# Patient Record
Sex: Female | Born: 1975 | Race: White | Hispanic: No | Marital: Married | Smoking: Never smoker
Health system: Southern US, Community
[De-identification: ages and names within clinical notes are randomized; demographics above are authoritative.]

## PROBLEM LIST (undated history)

## (undated) DIAGNOSIS — R7989 Other specified abnormal findings of blood chemistry: Secondary | ICD-10-CM

## (undated) DIAGNOSIS — L719 Rosacea, unspecified: Secondary | ICD-10-CM

## (undated) DIAGNOSIS — J45909 Unspecified asthma, uncomplicated: Secondary | ICD-10-CM

## (undated) DIAGNOSIS — T7840XA Allergy, unspecified, initial encounter: Secondary | ICD-10-CM

## (undated) DIAGNOSIS — Z8781 Personal history of (healed) traumatic fracture: Secondary | ICD-10-CM

## (undated) HISTORY — DX: Allergy, unspecified, initial encounter: T78.40XA

## (undated) HISTORY — DX: Other specified abnormal findings of blood chemistry: R79.89

## (undated) HISTORY — DX: Rosacea, unspecified: L71.9

## (undated) HISTORY — DX: Personal history of (healed) traumatic fracture: Z87.81

## (undated) HISTORY — PX: APPENDECTOMY: SHX54

## (undated) HISTORY — DX: Unspecified asthma, uncomplicated: J45.909

---

## 1999-08-29 ENCOUNTER — Other Ambulatory Visit: Admission: RE | Admit: 1999-08-29 | Discharge: 1999-08-29 | Payer: Self-pay | Admitting: *Deleted

## 2000-09-09 ENCOUNTER — Other Ambulatory Visit: Admission: RE | Admit: 2000-09-09 | Discharge: 2000-09-09 | Payer: Self-pay | Admitting: Obstetrics and Gynecology

## 2001-09-01 ENCOUNTER — Other Ambulatory Visit: Admission: RE | Admit: 2001-09-01 | Discharge: 2001-09-01 | Payer: Self-pay | Admitting: Obstetrics & Gynecology

## 2002-09-22 ENCOUNTER — Other Ambulatory Visit: Admission: RE | Admit: 2002-09-22 | Discharge: 2002-09-22 | Payer: Self-pay | Admitting: Obstetrics & Gynecology

## 2011-02-13 ENCOUNTER — Ambulatory Visit: Payer: Self-pay

## 2011-02-13 ENCOUNTER — Other Ambulatory Visit: Payer: Self-pay | Admitting: Occupational Medicine

## 2011-02-13 DIAGNOSIS — M79672 Pain in left foot: Secondary | ICD-10-CM

## 2013-12-29 ENCOUNTER — Ambulatory Visit (INDEPENDENT_AMBULATORY_CARE_PROVIDER_SITE_OTHER): Payer: BC Managed Care – PPO | Admitting: Internal Medicine

## 2013-12-29 ENCOUNTER — Encounter: Payer: Self-pay | Admitting: Internal Medicine

## 2013-12-29 VITALS — BP 132/90 | HR 76 | Temp 98.3°F | Wt 216.0 lb

## 2013-12-29 DIAGNOSIS — J0100 Acute maxillary sinusitis, unspecified: Secondary | ICD-10-CM

## 2013-12-29 MED ORDER — CEFUROXIME AXETIL 500 MG PO TABS: 500.0000 mg | ORAL_TABLET | Freq: Two times a day (BID) | ORAL | Status: DC

## 2013-12-29 NOTE — Patient Instructions (Signed)

## 2013-12-29 NOTE — Progress Notes (Signed)
Pre visit review using our clinic review tool, if applicable. No additional management support is needed unless otherwise documented below in the visit note. 

## 2013-12-29 NOTE — Progress Notes (Signed)
HPI  Pt presents to the clinic today with c/o cough and chest congestion. This started about 2 weeks ago. She has had some associated nasal congestion, facial pressure. She is blowing green mucous out of her nose. She denies fever, chills or body aches. She has tried Tylenol cold and flu and Tussin. She does have a history of seasonal allergies and asthma. She has had sick contacts. She does not smoke.  Review of Systems     History reviewed. No pertinent past medical history.  History reviewed. No pertinent family history.  History   Social History  . Marital Status: Married    Spouse Name: N/A    Number of Children: N/A  . Years of Education: N/A   Occupational History  . Not on file.   Social History Main Topics  . Smoking status: Never Smoker   . Smokeless tobacco: Not on file  . Alcohol Use: No  . Drug Use: Not on file  . Sexual Activity: Not on file   Other Topics Concern  . Not on file   Social History Narrative  . No narrative on file    No Known Allergies   Constitutional: Positive headache, fatigue. Denies fever or abrupt weight changes.  HEENT:  Positive facial pressure, nasal congestion and sore throat. Denies eye redness, eye pain, pressure behind the eyes, ear pain, ringing in the ears, wax buildup, runny nose or bloody nose. Respiratory: Positive cough. Denies difficulty breathing or shortness of breath.  Cardiovascular: Denies chest pain, chest tightness, palpitations or swelling in the hands or feet.   No other specific complaints in a complete review of systems (except as listed in HPI above).  Objective:   BP 132/90 mmHg  Pulse 76  Temp(Src) 98.3 F (36.8 C) (Oral)  Wt 216 lb (97.977 kg)  SpO2 98%  LMP 11/30/2013 Wt Readings from Last 3 Encounters:  12/29/13 216 lb (97.977 kg)     General: Appears her stated age, well developed, well nourished in NAD. HEENT: Head: normal shape and size, maxillary sinus tenderness noted; Ears: Tm's gray  and intact, normal light reflex; Nose: mucosa pink and moist, septum midline; Throat/Mouth: + PND. Teeth present, mucosa erythematous and moist, no exudate noted, no lesions or ulcerations noted.  Cardiovascular: Normal rate and rhythm. S1,S2 noted.  No murmur, rubs or gallops noted. Pulmonary/Chest: Normal effort and scattered rhonchi throughout. No respiratory distress. No wheezes, rales or noted.      Assessment & Plan:   Acute Maxillary Sinusitis/Bronchitis:  No wheezing-use albuterol prn, no need for prednisone Will give RX for ceftin BID x 10 days (she reports augmentin does not work) Try flonase OTC Delsym OTC for cough  RTC as needed or if symptoms persist.

## 2014-03-21 ENCOUNTER — Encounter: Payer: Self-pay | Admitting: Family Medicine

## 2014-03-21 ENCOUNTER — Ambulatory Visit (INDEPENDENT_AMBULATORY_CARE_PROVIDER_SITE_OTHER): Payer: BC Managed Care – PPO | Admitting: Family Medicine

## 2014-03-21 VITALS — BP 132/86 | HR 74 | Temp 98.2°F | Ht 64.0 in | Wt 224.6 lb

## 2014-03-21 DIAGNOSIS — J302 Other seasonal allergic rhinitis: Secondary | ICD-10-CM | POA: Insufficient documentation

## 2014-03-21 DIAGNOSIS — J45909 Unspecified asthma, uncomplicated: Secondary | ICD-10-CM

## 2014-03-21 DIAGNOSIS — E7211 Homocystinuria: Secondary | ICD-10-CM

## 2014-03-21 DIAGNOSIS — E7219 Other disorders of sulfur-bearing amino-acid metabolism: Secondary | ICD-10-CM

## 2014-03-21 DIAGNOSIS — Z0000 Encounter for general adult medical examination without abnormal findings: Secondary | ICD-10-CM

## 2014-03-21 DIAGNOSIS — L719 Rosacea, unspecified: Secondary | ICD-10-CM | POA: Insufficient documentation

## 2014-03-21 DIAGNOSIS — J069 Acute upper respiratory infection, unspecified: Secondary | ICD-10-CM | POA: Insufficient documentation

## 2014-03-21 DIAGNOSIS — Z23 Encounter for immunization: Secondary | ICD-10-CM

## 2014-03-21 DIAGNOSIS — J45998 Other asthma: Secondary | ICD-10-CM

## 2014-03-21 MED ORDER — METRONIDAZOLE 0.75 % EX LOTN: TOPICAL_LOTION | CUTANEOUS | Status: DC

## 2014-03-21 NOTE — Assessment & Plan Note (Signed)
With papules/pustules-mainly on cheeks Recommend gentle cleanser Avoid scrubs/sun - given a handout on this, also hot liquids/alcohol Px for metro cream to use qd to bid Update if no improvement in several months

## 2014-03-21 NOTE — Assessment & Plan Note (Signed)
She will check with her allergist to see if utd on her pneumonia vaccines

## 2014-03-21 NOTE — Progress Notes (Signed)
Subjective:    Patient ID: Erica Rollins, female    DOB: July 28, 1975, 39 y.o.   MRN: [id number]  HPI New to [place] to est for primary care -thinks she may have a cold  61 y o   Used to go to [facility] [place]   Pre K teacher -enjoys it  Has a daughter with spina bifida   Hx of asthma -- bad as a child -had several hosp for pneumonia and this (no intubation)  Now only bothers her when sick Sees Johnnette Barrios  She takes all shots    Did get her flu shot 9/15  She ? When her last pneumovax was- thinks she is up to date   Hx fo MTHFR - gene mutation - takes folic acid and B vits   Not planning on more kids at this time  On oral contraception-controls menses  Pap 10/15 - sees gyn   Broke L foot 3 years ago- stepping off of a chair  Has not had dexa  She does take ca and D   Thinks she may have a cold  Some head pressure and drainage for about a week  Some green d/c in am from head - congestion  Takes zyrtec  Does try to drink more water No fever  Some cough- dry   In general - eats fair diet but she does not like vegetables  She does not like cooking them  Tolerates raw carrots/ celery  She does eat salads  Does eat fish and chicken and low fat ground beef (rarely a steak)   [facility]  She gets 6000-8000 steps per day    ? Last cholesterol check -never told it was high    Patient Active Problem List   Diagnosis Date Noted  . Seasonal allergies 03/21/2014  . Asthma, mild 03/21/2014  . Routine general medical examination at a health care facility 03/21/2014  . Viral URI 03/21/2014  . Rosacea 03/21/2014  . 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 No pertinent past medical history. Past Surgical History  Procedure Laterality Date  . Appendectomy    . Cesarean section  2007   History  Substance Use Topics  . Smoking status: Never Smoker   . Smokeless tobacco: Not on file  . Alcohol Use: No   Family  History  Problem Relation Age of Onset  . Cancer Mother   . Stroke Paternal Uncle   . Arthritis Maternal Grandmother   . Stroke Paternal Grandmother   . Heart disease Paternal Grandfather    No Known Allergies Current Outpatient Prescriptions on File Prior to Visit  Medication Sig Dispense Refill  . Ascorbic Acid (VITAMIN C PO) Take 1 capsule by mouth daily.    . Calcium Carbonate-Vitamin D (CALCIUM + D PO) Take 1 capsule by mouth daily.    . cetirizine (ZYRTEC) 10 MG tablet Take 10 mg by mouth daily.    Marland Kitchen FIBER PO Take 4 g by mouth daily.     Marland Kitchen FOLIC ACID PO Take 1 tablet by mouth daily.    Marland Kitchen PORTIA-28 0.15-30 MG-MCG tablet     . Prenatal Vit-Fe Fumarate-FA (MULTIVITAMIN-PRENATAL) 27-0.8 MG TABS tablet Take 1 tablet by mouth daily at 12 noon.     No current facility-administered medications on file prior to visit.    Review of Systems Review of Systems  Constitutional: Negative for fever, appetite change, fatigue and unexpected weight change.  Eyes: Negative for pain and visual disturbance.  Respiratory: Negative for cough and shortness of breath.   Cardiovascular: Negative for cp or palpitations    Gastrointestinal: Negative for nausea, diarrhea and constipation.  Genitourinary: Negative for urgency and frequency.  Skin: Negative for pallor or rash  pos for breakouts on cheeks with red skin  Neurological: Negative for weakness, light-headedness, numbness and headaches.  Hematological: Negative for adenopathy. Does not bruise/bleed easily.  Psychiatric/Behavioral: Negative for dysphoric mood. The patient is not nervous/anxious.  pos for stressors        Objective:   Physical Exam  Constitutional: She appears well-developed and well-nourished. No distress.  obese and well appearing   HENT:  Head: Normocephalic and atraumatic.  Right Ear: External ear normal.  Left Ear: External ear normal.  Nose: Nose normal.  Mouth/Throat: Oropharynx is clear and moist.  Nares are boggy   Eyes: Conjunctivae and EOM are normal. Pupils are equal, round, and reactive to light. Right eye exhibits no discharge. Left eye exhibits no discharge. No scleral icterus.  Neck: Normal range of motion. Neck supple. No JVD present. No thyromegaly present.  Cardiovascular: Normal rate, regular rhythm, normal heart sounds and intact distal pulses.  Exam reveals no gallop.   Pulmonary/Chest: Effort normal and breath sounds normal. No respiratory distress. She has no wheezes. She has no rales.  Abdominal: Soft. Bowel sounds are normal. She exhibits no distension and no mass. There is no tenderness.  Musculoskeletal: She exhibits no edema or tenderness.  Lymphadenopathy:    She has no cervical adenopathy.  Neurological: She is alert. She has normal reflexes. No cranial nerve deficit. She exhibits normal muscle tone. Coordination normal.  Skin: Skin is warm and dry. No rash noted. No erythema. No pallor.  Erythema/flushing of cheeks with papules and a few pustules (on cheeks)     Psychiatric: She has a normal mood and affect.          Assessment & Plan:   Problem List Items Addressed This Visit      Respiratory   Asthma, mild    She will check with her allergist to see if utd on her pneumonia vaccines       Seasonal allergies - Primary   Viral URI    Mild with reassuring exam Disc symptomatic care - see instructions on AVS  Update if not starting to improve in a week or if worsening  -esp if sinus pain         Musculoskeletal and Integument   Rosacea    With papules/pustules-mainly on cheeks Recommend gentle cleanser Avoid scrubs/sun - given a handout on this, also hot liquids/alcohol Px for metro cream to use qd to bid Update if no improvement in several months         Other   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 Has a daughter with spina bifida  Disc imp of watching her wt/ bp and cholesterol to avoid vasc dz No hx of blood clots       Routine  general medical examination at a health care facility    Reviewed health habits including diet and exercise and skin cancer prevention Reviewed appropriate screening tests for age  Also reviewed health mt list, fam hx and immunization status , as well as social and family history   Tdap given Wellness labs today  Disc imp of cholesterol control and vasc dz prev with hx of high homocysteine  Disc good diet and exercise  plan (given DASH diet)- esp in light of fam hx of HTN and also her HTN with pregnancy and obesity       Relevant Orders   CBC with Differential/Platelet   Comprehensive metabolic panel   TSH   Lipid panel    Other Visit Diagnoses    Need for diphtheria-tetanus-pertussis (Tdap) vaccine, adult/adolescent        Relevant Orders    Tdap vaccine greater than or equal to 7yo IM (Completed)

## 2014-03-21 NOTE — Assessment & Plan Note (Signed)
Reviewed health habits including diet and exercise and skin cancer prevention Reviewed appropriate screening tests for age  Also reviewed health mt list, fam hx and immunization status , as well as social and family history   Tdap given Wellness labs today  Disc imp of cholesterol control and vasc dz prev with hx of high homocysteine  Disc good diet and exercise plan (given DASH diet)- esp in light of fam hx of HTN and also her HTN with pregnancy and obesity

## 2014-03-21 NOTE — Patient Instructions (Addendum)
Tdap vaccine today  Eat more raw vegetables  Get back to weight watchers if it works for you - try to exercise more as well  Wellness labs today  Try the metro lotion for rosacea  Try mineral make up - powder- it has fair sun protection property  For cold symptoms - mucinex/ fluids/ nasal saline spray/ breathe steam - Update if not starting to improve in a week or if worsening    Take care of yourself

## 2014-03-21 NOTE — Assessment & Plan Note (Signed)
Mild with reassuring exam Disc symptomatic care - see instructions on AVS  Update if not starting to improve in a week or if worsening  -esp if sinus pain

## 2014-03-21 NOTE — Progress Notes (Signed)
Pre visit review using our clinic review tool, if applicable. No additional management support is needed unless otherwise documented below in the visit note. 

## 2014-03-21 NOTE — Assessment & Plan Note (Signed)
Pt carries a gene for this Has a daughter with spina bifida  Disc imp of watching her wt/ bp and cholesterol to avoid vasc dz No hx of blood clots

## 2014-03-22 LAB — COMPREHENSIVE METABOLIC PANEL
ALBUMIN: 3.8 g/dL (ref 3.5–5.2)
ALK PHOS: 54 U/L (ref 39–117)
ALT: 12 U/L (ref 0–35)
AST: 16 U/L (ref 0–37)
BUN: 11 mg/dL (ref 6–23)
CALCIUM: 9.1 mg/dL (ref 8.4–10.5)
CO2: 26 mEq/L (ref 19–32)
CREATININE: 0.86 mg/dL (ref 0.40–1.20)
Chloride: 107 mEq/L (ref 96–112)
GFR: 78.7 mL/min (ref >60.00)
GLUCOSE: 89 mg/dL (ref 70–99)
POTASSIUM: 4.3 meq/L (ref 3.5–5.1)
Sodium: 139 mEq/L (ref 135–145)
Total Bilirubin: 0.2 mg/dL (ref 0.2–1.2)
Total Protein: 6.8 g/dL (ref 6.0–8.3)

## 2014-03-22 LAB — LIPID PANEL
CHOLESTEROL: 185 mg/dL (ref 0–200)
HDL: 45.4 mg/dL (ref >39.00)
LDL CALC: 118 mg/dL — AB (ref 0–99)
NonHDL: 139.6
Total CHOL/HDL Ratio: 4
Triglycerides: 106 mg/dL (ref 0.0–149.0)
VLDL: 21.2 mg/dL (ref 0.0–40.0)

## 2014-03-22 LAB — CBC WITH DIFFERENTIAL/PLATELET
BASOS ABS: 0 10*3/uL (ref 0.0–0.1)
Basophils Relative: 0.3 % (ref 0.0–3.0)
EOS ABS: 0.6 10*3/uL (ref 0.0–0.7)
Eosinophils Relative: 6.5 % — ABNORMAL HIGH (ref 0.0–5.0)
HEMATOCRIT: 39.2 % (ref 36.0–46.0)
HEMOGLOBIN: 13.4 g/dL (ref 12.0–15.0)
LYMPHS ABS: 2.7 10*3/uL (ref 0.7–4.0)
LYMPHS PCT: 27.8 % (ref 12.0–46.0)
MCHC: 34.1 g/dL (ref 30.0–36.0)
MCV: 87 fl (ref 78.0–100.0)
Monocytes Absolute: 0.5 10*3/uL (ref 0.1–1.0)
Monocytes Relative: 5.6 % (ref 3.0–12.0)
NEUTROS ABS: 5.8 10*3/uL (ref 1.4–7.7)
Neutrophils Relative %: 59.8 % (ref 43.0–77.0)
PLATELETS: 238 10*3/uL (ref 150.0–400.0)
RBC: 4.5 Mil/uL (ref 3.87–5.11)
RDW: 13.1 % (ref 11.5–15.5)
WBC: 9.7 10*3/uL (ref 4.0–10.5)

## 2014-03-22 LAB — TSH: TSH: 0.91 u[IU]/mL (ref 0.35–4.50)

## 2014-03-23 ENCOUNTER — Encounter: Payer: Self-pay | Admitting: *Deleted

## 2014-07-19 ENCOUNTER — Ambulatory Visit (INDEPENDENT_AMBULATORY_CARE_PROVIDER_SITE_OTHER): Payer: BC Managed Care – PPO | Admitting: Family Medicine

## 2014-07-19 ENCOUNTER — Encounter: Payer: Self-pay | Admitting: Family Medicine

## 2014-07-19 VITALS — BP 128/80 | HR 83 | Temp 98.7°F | Ht 64.0 in | Wt 221.8 lb

## 2014-07-19 DIAGNOSIS — J040 Acute laryngitis: Secondary | ICD-10-CM | POA: Diagnosis not present

## 2014-07-19 DIAGNOSIS — J0100 Acute maxillary sinusitis, unspecified: Secondary | ICD-10-CM

## 2014-07-19 DIAGNOSIS — J019 Acute sinusitis, unspecified: Secondary | ICD-10-CM | POA: Insufficient documentation

## 2014-07-19 MED ORDER — AMOXICILLIN-POT CLAVULANATE 875-125 MG PO TABS: 1.0000 | ORAL_TABLET | Freq: Two times a day (BID) | ORAL | Status: DC

## 2014-07-19 NOTE — Progress Notes (Signed)
Pre visit review using our clinic review tool, if applicable. No additional management support is needed unless otherwise documented below in the visit note. 

## 2014-07-19 NOTE — Progress Notes (Signed)
Subjective:    Patient ID: Erica Rollins, female    DOB: [birth year], 39 y.o.   MRN: [id number]  HPI Here with uri symptoms   Woke up with ST on Friday  Then very hoarse  A lot of green nasal drainage Sinus pain and pressure Ear pain  Started cough this am   No fever   otc- taking tylenol cold and sinus - helped a little   Patient Active Problem List   Diagnosis Date Noted  . Seasonal allergies 03/21/2014  . Asthma, mild 03/21/2014  . Routine general medical examination at a health care facility 03/21/2014  . Viral URI 03/21/2014  . Rosacea 03/21/2014  . 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 Allergy   . Asthma, mild   . Rosacea   . High plasma total homocysteine   . History of foot fracture    Past Surgical History  Procedure Laterality Date  . Appendectomy    . Cesarean section  2007   History  Substance Use Topics  . Smoking status: Never Smoker   . Smokeless tobacco: Not on file  . Alcohol Use: No   Family History  Problem Relation Age of Onset  . Cancer Mother   . Stroke Paternal Uncle   . Arthritis Maternal Grandmother   . Stroke Paternal Grandmother   . Heart disease Paternal Grandfather    No Known Allergies Current Outpatient Prescriptions on File Prior to Visit  Medication Sig Dispense Refill  . Ascorbic Acid (VITAMIN C PO) Take 1 capsule by mouth daily.    . Calcium Carbonate-Vitamin D (CALCIUM + D PO) Take 1 capsule by mouth daily.    . cetirizine (ZYRTEC) 10 MG tablet Take 10 mg by mouth daily.    Marland Kitchen FIBER PO Take 4 g by mouth daily.     Marland Kitchen FOLIC ACID PO Take 1 tablet by mouth daily.    Marland Kitchen METRONIDAZOLE, TOPICAL, 0.75 % LOTN Apply to affected areas once to twice daily in a thin film for rosacea 1 Bottle 3  . PORTIA-28 0.15-30 MG-MCG tablet     . Prenatal Vit-Fe Fumarate-FA (MULTIVITAMIN-PRENATAL) 27-0.8 MG TABS tablet Take 1 tablet by mouth daily at 12 noon.     No current facility-administered medications  on file prior to visit.      Review of Systems Review of Systems  Constitutional: Negative for fever, appetite change,  and unexpected weight change.  ENT pos for sinus pain /cong/rhinorrhea and st and hoareness Eyes: Negative for pain and visual disturbance.  Respiratory: Negative for wheeze  and shortness of breath.   Cardiovascular: Negative for cp or palpitations    Gastrointestinal: Negative for nausea, diarrhea and constipation.  Genitourinary: Negative for urgency and frequency.  Skin: Negative for pallor or rash   Neurological: Negative for weakness, light-headedness, numbness and headaches.  Hematological: Negative for adenopathy. Does not bruise/bleed easily.  Psychiatric/Behavioral: Negative for dysphoric mood. The patient is not nervous/anxious.         Objective:   Physical Exam  Constitutional: She appears well-developed and well-nourished. No distress.  obese and well appearing   HENT:  Head: Normocephalic and atraumatic.  Right Ear: External ear normal.  Left Ear: External ear normal.  Mouth/Throat: Oropharynx is clear and moist. No oropharyngeal exudate.  Nares are injected and congested  Bilateral maxillary sinus tenderness  Post nasal drip   Hoarse voice   Eyes: Conjunctivae and EOM are normal. Pupils are equal, round, and reactive  to light. Right eye exhibits no discharge. Left eye exhibits no discharge.  Neck: Normal range of motion. Neck supple.  Cardiovascular: Normal rate and regular rhythm.   Pulmonary/Chest: Effort normal and breath sounds normal. No respiratory distress. She has no wheezes. She has no rales.  Lymphadenopathy:    She has no cervical adenopathy.  Neurological: She is alert. No cranial nerve deficit.  Skin: Skin is warm and dry. No rash noted.  Psychiatric: She has a normal mood and affect.          Assessment & Plan:   Problem List Items Addressed This Visit    Acute sinusitis - Primary    With viral uri and predisposing  allergy congestion Cover with augmentin Disc symptomatic care - see instructions on AVS  Update if not starting to improve in a week or if worsening        Relevant Medications   amoxicillin-clavulanate (AUGMENTIN) 875-125 MG per tablet   Laryngitis, acute    Likely viral Disc symptomatic care - see instructions on AVS  Update if not starting to improve in a week or if worsening

## 2014-07-19 NOTE — Patient Instructions (Signed)
Drink lots of fluids and rest voice Salt water gargle helps throat  Take augmentin for sinus infection  mucinex for congestion  The sinus infection will likely improve before the virus does   Cough will get worse before it gets better    Update if not starting to improve in a week or if worsening

## 2014-07-20 NOTE — Assessment & Plan Note (Signed)
With viral uri and predisposing allergy congestion Cover with augmentin Disc symptomatic care - see instructions on AVS  Update if not starting to improve in a week or if worsening

## 2014-07-20 NOTE — Assessment & Plan Note (Signed)
Likely viral Disc symptomatic care - see instructions on AVS  Update if not starting to improve in a week or if worsening

## 2015-12-12 ENCOUNTER — Ambulatory Visit (INDEPENDENT_AMBULATORY_CARE_PROVIDER_SITE_OTHER): Payer: BC Managed Care – PPO | Admitting: Family Medicine

## 2015-12-12 ENCOUNTER — Encounter: Payer: Self-pay | Admitting: Family Medicine

## 2015-12-12 VITALS — BP 116/68 | HR 76 | Temp 98.6°F | Ht 64.0 in | Wt 221.5 lb

## 2015-12-12 DIAGNOSIS — J0110 Acute frontal sinusitis, unspecified: Secondary | ICD-10-CM | POA: Diagnosis not present

## 2015-12-12 DIAGNOSIS — J019 Acute sinusitis, unspecified: Secondary | ICD-10-CM | POA: Insufficient documentation

## 2015-12-12 MED ORDER — AMOXICILLIN-POT CLAVULANATE 875-125 MG PO TABS: 1.0000 | ORAL_TABLET | Freq: Two times a day (BID) | ORAL | 0 refills | Status: DC

## 2015-12-12 NOTE — Progress Notes (Signed)
Subjective:    Patient ID: Erica Rollins, female    DOB: 10-24-1975, 40 y.o.   MRN: [id number]  HPI Here for a month long cough  Pulled a muscle from cough  Hacking cough  Yesterday- a little bit of mucous / clear  pnd  Feels congested  Sinus pain over her eyes -thinks she has a sinus infection  ? 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 Acute sinusitis 12/12/2015  . Seasonal allergies 03/21/2014  . Asthma, mild 03/21/2014  . Routine general medical examination at a health care facility 03/21/2014  . Rosacea 03/21/2014  . 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 Allergy   . Asthma, mild   . High plasma total homocysteine   . History of foot fracture   . Rosacea    Past Surgical History:  Procedure Laterality Date  . APPENDECTOMY    . CESAREAN SECTION  2007   Social History  Substance Use Topics  . Smoking status: Never Smoker  . Smokeless tobacco: Never Used  . Alcohol use No   Family History  Problem Relation Age of Onset  . Cancer Mother   . Stroke Paternal Uncle   . Arthritis Maternal Grandmother   . Stroke Paternal Grandmother   . Heart disease Paternal Grandfather    No Known Allergies Current Outpatient Prescriptions on File Prior to Visit  Medication Sig Dispense Refill  . Ascorbic Acid (VITAMIN C PO) Take 1 capsule by mouth daily.    . Calcium Carbonate-Vitamin D (CALCIUM + D PO) Take 1 capsule by mouth daily.    Marland Kitchen. FIBER PO Take 4 g by mouth daily.     Marland Kitchen. FOLIC ACID PO Take 1 tablet by mouth daily.    Marland Kitchen. PORTIA-28 0.15-30 MG-MCG tablet     . Prenatal Vit-Fe Fumarate-FA (MULTIVITAMIN-PRENATAL) 27-0.8 MG TABS tablet Take 1 tablet by mouth daily at 12 noon.     No  current facility-administered medications on file prior to visit.      Review of Systems  Constitutional: Positive for appetite change. Negative for fatigue and fever.  HENT: Positive for congestion, ear pain, postnasal drip, rhinorrhea, sinus pressure and sore throat. Negative for nosebleeds.   Eyes: Negative for pain, redness and itching.  Respiratory: Positive for cough. Negative for shortness of breath and wheezing.   Cardiovascular: Negative for chest pain.  Gastrointestinal: Negative for abdominal pain, diarrhea, nausea and vomiting.  Endocrine: Negative for polyuria.  Genitourinary: Negative for dysuria, frequency and urgency.  Musculoskeletal: Negative for arthralgias and myalgias.  Allergic/Immunologic: Negative for immunocompromised state.  Neurological: Positive for headaches. Negative for dizziness, tremors, syncope, weakness and numbness.  Hematological: Negative for adenopathy. Does not bruise/bleed easily.  Psychiatric/Behavioral: Negative for dysphoric mood. The patient is not nervous/anxious.        Objective:   Physical Exam  Constitutional: She appears well-developed and well-nourished. No distress.  Well appearing   HENT:  Head: Normocephalic and atraumatic.  Right Ear: External ear normal.  Left Ear: External ear normal.  Mouth/Throat: Oropharynx is clear and moist. No oropharyngeal exudate.  Nares are injected and congested  Bilateral maxillary sinus tenderness  Post nasal drip  Eyes: Conjunctivae and EOM are normal. Pupils are equal, round, and reactive to light. Right eye exhibits no discharge. Left eye exhibits no discharge.  Neck: Normal range of motion. Neck supple.  Cardiovascular: Normal rate and regular rhythm.   Pulmonary/Chest: Effort normal and breath sounds normal. No respiratory distress. She has no wheezes. She has no rales.  Lymphadenopathy:    She has no cervical adenopathy.  Neurological: She is alert. No cranial nerve deficit.  Skin:  Skin is warm and dry. No rash noted.  Psychiatric: She has a normal mood and affect.          Assessment & Plan:   Problem List Items Addressed This Visit      Respiratory   Acute sinusitis    S/p allergies/uri I think you have a sinus infection causing cough  Breathe steam  Nasal saline helps congestion  flonase daily - through allergy season  Drink lots of fluids Take augmentin as directed  Use a warm compress on your ribs as needed   Update if not starting to improve in a week or if worsening       Relevant Medications   fexofenadine (ALLEGRA) 180 MG tablet   amoxicillin-clavulanate (AUGMENTIN) 875-125 MG tablet

## 2015-12-12 NOTE — Patient Instructions (Signed)
I think you have a sinus infection causing cough  Breathe steam  Nasal saline helps congestion  flonase daily - through allergy season  Drink lots of fluids Take augmentin as directed  Use a warm compress on your ribs as needed   Update if not starting to improve in a week or if worsening

## 2015-12-12 NOTE — Progress Notes (Signed)
Pre visit review using our clinic review tool, if applicable. No additional management support is needed unless otherwise documented below in the visit note. 

## 2015-12-14 NOTE — Assessment & Plan Note (Signed)
S/p allergies/uri I think you have a sinus infection causing cough  Breathe steam  Nasal saline helps congestion  flonase daily - through allergy season  Drink lots of fluids Take augmentin as directed  Use a warm compress on your ribs as needed   Update if not starting to improve in a week or if worsening

## 2015-12-23 ENCOUNTER — Encounter: Payer: Self-pay | Admitting: Family Medicine

## 2015-12-23 ENCOUNTER — Ambulatory Visit (INDEPENDENT_AMBULATORY_CARE_PROVIDER_SITE_OTHER): Payer: BC Managed Care – PPO | Admitting: Family Medicine

## 2015-12-23 VITALS — BP 120/80 | HR 77 | Temp 98.2°F | Resp 18 | Ht 64.0 in | Wt 221.0 lb

## 2015-12-23 DIAGNOSIS — J0110 Acute frontal sinusitis, unspecified: Secondary | ICD-10-CM

## 2015-12-23 DIAGNOSIS — J4521 Mild intermittent asthma with (acute) exacerbation: Secondary | ICD-10-CM

## 2015-12-23 DIAGNOSIS — R062 Wheezing: Secondary | ICD-10-CM

## 2015-12-23 MED ORDER — GUAIFENESIN-CODEINE 100-10 MG/5ML PO SYRP: 10.0000 mL | ORAL_SOLUTION | Freq: Three times a day (TID) | ORAL | 0 refills | Status: DC | PRN

## 2015-12-23 MED ORDER — DOXYCYCLINE HYCLATE 100 MG PO TABS: 100.0000 mg | ORAL_TABLET | Freq: Two times a day (BID) | ORAL | 0 refills | Status: DC

## 2015-12-23 MED ORDER — ALBUTEROL SULFATE HFA 108 (90 BASE) MCG/ACT IN AERS: 2.0000 | INHALATION_SPRAY | Freq: Four times a day (QID) | RESPIRATORY_TRACT | 1 refills | Status: DC | PRN

## 2015-12-23 MED ORDER — IPRATROPIUM-ALBUTEROL 0.5-2.5 (3) MG/3ML IN SOLN
3.0000 mL | Freq: Once | RESPIRATORY_TRACT | Status: AC
  Administered 2015-12-23: 3 mL via RESPIRATORY_TRACT

## 2015-12-23 NOTE — Progress Notes (Signed)
   Subjective:    Patient ID: Erica Rollins, female    DOB: [birth year], 40 y.o.   MRN: [id number]  HPI Cough- pt was seen on 11/7 and started on Augmentin for sinusitis.  She completed abx on Tuesday as directed.  Continues to cough, has some chest rattling.  Hx of asthma.  Continues to have sinus congestion w/ intermittent pain.  Cough is intermittently productive.  No fevers.    Review of Systems For ROS see HPI     Objective:   Physical Exam  Constitutional: She appears well-developed and well-nourished. No distress.  HENT:  Head: Normocephalic and atraumatic.  Right Ear: Tympanic membrane normal.  Left Ear: Tympanic membrane normal.  Nose: Mucosal edema and rhinorrhea present. Right sinus exhibits maxillary sinus tenderness and frontal sinus tenderness. Left sinus exhibits maxillary sinus tenderness and frontal sinus tenderness.  Mouth/Throat: Uvula is midline and mucous membranes are normal. Posterior oropharyngeal erythema present. No oropharyngeal exudate.  Eyes: Conjunctivae and EOM are normal. Pupils are equal, round, and reactive to light.  Neck: Normal range of motion. Neck supple.  Cardiovascular: Normal rate, regular rhythm and normal heart sounds.   Pulmonary/Chest: Effort normal. No respiratory distress. She has wheezes (pt has diffuse expiratory wheezes- improved s/p neb tx in office).  Lymphadenopathy:    She has no cervical adenopathy.          Assessment & Plan:  Sinusitis- pt w/ continued pain w/ TTP over frontal and maxillary sinuses.  Start Doxy as pt just completed 7 day course of Augmentin.  Reviewed supportive care and red flags that should prompt return.  Pt expressed understanding and is in agreement w/ plan.   Asthma exacerbation- new.  Pt w/ wheezing on exam today.  This improved s/p neb tx in office.  Refill provided on albuterol inhaler.  abx given.  Reviewed supportive care and red flags that should prompt return.  Pt expressed understanding and is in  agreement w/ plan.

## 2015-12-23 NOTE — Progress Notes (Signed)
Pre-visit discussion using our clinic review tool. No additional management support is needed unless otherwise documented below in the visit note.  

## 2015-12-23 NOTE — Patient Instructions (Signed)
Follow up as needed Start the Doxycyline twice daily- take w/ food Drink plenty of fluids Mucinex DM for daytime cough, syrup for nights and weekends (will cause drowsiness) Use the albuterol inhaler for chest tightness, shortness of breath or wheezing Call with any questions or concerns Hang in there!!! Happy Holidays! Good luck to your daughter!!!

## 2016-01-19 ENCOUNTER — Telehealth: Payer: Self-pay

## 2016-01-19 MED ORDER — DOXYCYCLINE HYCLATE 100 MG PO TABS: 100.0000 mg | ORAL_TABLET | Freq: Two times a day (BID) | ORAL | 0 refills | Status: DC

## 2016-01-19 NOTE — Telephone Encounter (Signed)
Pt notified Rx sent and to f/u if no improvement  °

## 2016-01-19 NOTE — Telephone Encounter (Signed)
Pt said the doxycycline helped. Her sxs had resolved for about a week and then came right back

## 2016-01-19 NOTE — Telephone Encounter (Signed)
Pt left v/m; pt was seen on 12/12/15 and 12/23/15 for sinusitis. Pt thinks infection has returned; pt has sinus pressure h/a,head congestion with green mucus,and sinus drainage. Pt wants to know if can have med sent to [facility] or what to do. Pt request cb.

## 2016-01-19 NOTE — Telephone Encounter (Signed)
Did the doxycycline help from Dr Beverely Lowabori?  How long were you better for?

## 2016-01-19 NOTE — Telephone Encounter (Signed)
Let's repeat that and then f/u if no improvement  I sent it

## 2017-03-12 ENCOUNTER — Encounter: Payer: Self-pay | Admitting: Family Medicine

## 2017-03-12 ENCOUNTER — Ambulatory Visit (INDEPENDENT_AMBULATORY_CARE_PROVIDER_SITE_OTHER): Payer: BC Managed Care – PPO | Admitting: Family Medicine

## 2017-03-12 VITALS — BP 124/78 | HR 71 | Temp 98.2°F | Ht 63.5 in | Wt 228.5 lb

## 2017-03-12 DIAGNOSIS — Z23 Encounter for immunization: Secondary | ICD-10-CM

## 2017-03-12 DIAGNOSIS — E7211 Homocystinuria: Secondary | ICD-10-CM | POA: Diagnosis not present

## 2017-03-12 DIAGNOSIS — L719 Rosacea, unspecified: Secondary | ICD-10-CM | POA: Diagnosis not present

## 2017-03-12 DIAGNOSIS — J302 Other seasonal allergic rhinitis: Secondary | ICD-10-CM

## 2017-03-12 DIAGNOSIS — Z0000 Encounter for general adult medical examination without abnormal findings: Secondary | ICD-10-CM

## 2017-03-12 DIAGNOSIS — E669 Obesity, unspecified: Secondary | ICD-10-CM

## 2017-03-12 LAB — CBC WITH DIFFERENTIAL/PLATELET
BASOS PCT: 0.6 % (ref 0.0–3.0)
Basophils Absolute: 0.1 10*3/uL (ref 0.0–0.1)
Eosinophils Absolute: 0.3 10*3/uL (ref 0.0–0.7)
Eosinophils Relative: 2.3 % (ref 0.0–5.0)
HEMATOCRIT: 41.4 % (ref 36.0–46.0)
HEMOGLOBIN: 13.8 g/dL (ref 12.0–15.0)
Lymphocytes Relative: 27.2 % (ref 12.0–46.0)
Lymphs Abs: 3 10*3/uL (ref 0.7–4.0)
MCHC: 33.4 g/dL (ref 30.0–36.0)
MCV: 88.4 fl (ref 78.0–100.0)
MONO ABS: 0.5 10*3/uL (ref 0.1–1.0)
Monocytes Relative: 4.4 % (ref 3.0–12.0)
NEUTROS ABS: 7.3 10*3/uL (ref 1.4–7.7)
Neutrophils Relative %: 65.5 % (ref 43.0–77.0)
Platelets: 282 10*3/uL (ref 150.0–400.0)
RBC: 4.68 Mil/uL (ref 3.87–5.11)
RDW: 13 % (ref 11.5–15.5)
WBC: 11.2 10*3/uL — AB (ref 4.0–10.5)

## 2017-03-12 LAB — COMPREHENSIVE METABOLIC PANEL
ALBUMIN: 3.8 g/dL (ref 3.5–5.2)
ALT: 15 U/L (ref 0–35)
AST: 18 U/L (ref 0–37)
Alkaline Phosphatase: 60 U/L (ref 39–117)
BUN: 13 mg/dL (ref 6–23)
CALCIUM: 9.6 mg/dL (ref 8.4–10.5)
CHLORIDE: 104 meq/L (ref 96–112)
CO2: 28 meq/L (ref 19–32)
Creatinine, Ser: 0.84 mg/dL (ref 0.40–1.20)
GFR: 79.63 mL/min (ref >60.00)
Glucose, Bld: 82 mg/dL (ref 70–99)
POTASSIUM: 4.2 meq/L (ref 3.5–5.1)
SODIUM: 137 meq/L (ref 135–145)
Total Bilirubin: 0.4 mg/dL (ref 0.2–1.2)
Total Protein: 7.3 g/dL (ref 6.0–8.3)

## 2017-03-12 LAB — LIPID PANEL
CHOL/HDL RATIO: 4
CHOLESTEROL: 189 mg/dL (ref 0–200)
HDL: 43.3 mg/dL (ref >39.00)
LDL CALC: 122 mg/dL — AB (ref 0–99)
NonHDL: 145.65
Triglycerides: 120 mg/dL (ref 0.0–149.0)
VLDL: 24 mg/dL (ref 0.0–40.0)

## 2017-03-12 LAB — TSH: TSH: 0.96 u[IU]/mL (ref 0.35–4.50)

## 2017-03-12 NOTE — Progress Notes (Signed)
Subjective:    Patient ID: Erica Rollins, female    DOB: [birth year], 42 y.o.   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  Trying to fit in outside of work  Diet -is fair   -needs to fit in some more vegetables  Doing some protein shakes  39.84 kg/m   Daughter is 5712 with spina bifida  Always a challenge  HIV screen=neg when pregnant 42 y ago  No desire for re screening   Pap  with gyn -last summer - [facility]  On [address]  OC -continuous /no menses  Also pnv  Breast cancer screening - will get mammogram with next gyn visit next  No lumps on self exam   Folic acid - hx of hyperhomocysteinemia   Flu shot 9/18  Tetanus shot 2/16  Pneumonia vaccine - with hx of asthma / would like to get today  She has reactive airways with illness   She stopped taking allergy shots   Last cholesterol Lab Results  Component Value Date   CHOL 185 03/21/2014   HDL 45.40 03/21/2014   LDLCALC 118 (H) 03/21/2014   TRIG 106.0 03/21/2014   CHOLHDL 4 03/21/2014   She eats occ bacon  Some french fries for lunches (othewise not fried foods)  Some red meat -when out   Patient Active Problem List   Diagnosis Date Noted  . Seasonal allergies 03/21/2014  . Asthma, mild 03/21/2014  . Routine general medical examination at a health care facility 03/21/2014  . Rosacea 03/21/2014  . 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 Allergy   . Asthma, mild   . High plasma total homocysteine   . History of foot fracture   . Rosacea    Past Surgical History:  Procedure Laterality Date  .  APPENDECTOMY    . CESAREAN SECTION  2007   Social History   Tobacco Use  . Smoking status: Never Smoker  . Smokeless tobacco: Never Used  Substance Use Topics  . Alcohol use: No    Alcohol/week: 0.0 oz  . Drug use: No   Family History  Problem Relation Age of Onset  . Cancer Mother   . Stroke Paternal Uncle   . Arthritis Maternal Grandmother   . Stroke Paternal Grandmother   . Heart disease Paternal Grandfather    No Known Allergies Current Outpatient Medications on File Prior to Visit  Medication Sig Dispense Refill  . albuterol (PROVENTIL HFA;VENTOLIN HFA) 108 (90 Base) MCG/ACT inhaler Inhale 2 puffs into the lungs every 6 (six) hours as needed for wheezing or shortness of breath. 1 Inhaler 1  . Ascorbic Acid (VITAMIN C PO) Take 1 capsule by mouth daily.    . Calcium Carbonate-Vitamin D (CALCIUM + D PO) Take 1 capsule by mouth daily.    . fexofenadine (ALLEGRA) 180 MG tablet Take 180 mg by mouth daily.    Marland Kitchen. FIBER PO Take 4 g by mouth daily.     Marland Kitchen. FOLIC ACID PO Take 1 tablet by mouth daily.    .Marland Kitchen  montelukast (SINGULAIR) 10 MG tablet Take 1 tablet by mouth daily as needed.     Marland Kitchen PORTIA-28 0.15-30 MG-MCG tablet     . Prenatal Vit-Fe Fumarate-FA (MULTIVITAMIN-PRENATAL) 27-0.8 MG TABS tablet Take 1 tablet by mouth daily at 12 noon.     No current facility-administered medications on file prior to visit.     Review of Systems  Constitutional: Negative for activity change, appetite change, fatigue, fever and unexpected weight change.  HENT: Positive for postnasal drip, rhinorrhea and sneezing. Negative for congestion, ear pain, sinus pressure and sore throat.   Eyes: Negative for pain, redness and visual disturbance.  Respiratory: Negative for cough, shortness of breath and wheezing.   Cardiovascular: Negative for chest pain and palpitations.  Gastrointestinal: Negative for abdominal pain, blood in stool, constipation and diarrhea.  Endocrine: Negative for polydipsia and  polyuria.  Genitourinary: Negative for dysuria, frequency and urgency.  Musculoskeletal: Negative for arthralgias, back pain and myalgias.  Skin: Negative for pallor and rash.  Allergic/Immunologic: Negative for environmental allergies.  Neurological: Negative for dizziness, syncope and headaches.  Hematological: Negative for adenopathy. Does not bruise/bleed easily.  Psychiatric/Behavioral: Negative for decreased concentration and dysphoric mood. The patient is not nervous/anxious.        Objective:   Physical Exam  Constitutional: She appears well-developed and well-nourished. No distress.  obese and well appearing   HENT:  Head: Normocephalic and atraumatic.  Right Ear: External ear normal.  Left Ear: External ear normal.  Nose: Nose normal.  Mouth/Throat: Oropharynx is clear and moist.  Boggy nares   Eyes: Conjunctivae and EOM are normal. Pupils are equal, round, and reactive to light. Right eye exhibits no discharge. Left eye exhibits no discharge. No scleral icterus.  Neck: Normal range of motion. Neck supple. No JVD present. Carotid bruit is not present. No thyromegaly present.  Cardiovascular: Normal rate, regular rhythm, normal heart sounds and intact distal pulses. Exam reveals no gallop.  Pulmonary/Chest: Effort normal and breath sounds normal. No respiratory distress. She has no wheezes. She has no rales.  No wheeze  Abdominal: Soft. Bowel sounds are normal. She exhibits no distension and no mass. There is no tenderness.  Musculoskeletal: She exhibits no edema or tenderness.  Lymphadenopathy:    She has no cervical adenopathy.  Neurological: She is alert. She has normal reflexes. No cranial nerve deficit. She exhibits normal muscle tone. Coordination normal.  Skin: Skin is warm and dry. No rash noted. No erythema. No pallor.  Solar lentigines diffusely   Psychiatric: She has a normal mood and affect.  Cheerful and talkative           Assessment & Plan:    Problem List Items Addressed This Visit      Musculoskeletal and Integument   Rosacea    Doing ok with topical tx         Other   Hyperhomocysteinemia (HCC)    Continues folic acid  Disc risk factors for vasc dz Lipid panel today  Rev diet/exercise habits       Obesity (BMI 30-39.9)    Discussed how this problem influences overall health and the risks it imposes  Reviewed plan for weight loss with lower calorie diet (via better food choices and also portion control or program like weight watchers) and exercise building up to or more than 30 minutes 5 days per week including some aerobic activity         Routine general medical examination at a health care facility - Primary  Reviewed health habits including diet and exercise and skin cancer prevention Reviewed appropriate screening tests for age  Also reviewed health mt list, fam hx and immunization status , as well as social and family history   See HPI Sent for gyn note Plans to get first mammogram this summer at routine gyn visit (they have machine in the office)  Disc diet/exercise Wellness labs today  Pneumovax today - hx of asthma       Relevant Orders   CBC with Differential/Platelet (Completed)   Comprehensive metabolic panel (Completed)   Lipid panel (Completed)   TSH (Completed)   Seasonal allergies    Other Visit Diagnoses    Need for 23-polyvalent pneumococcal polysaccharide vaccine       Relevant Orders   Pneumococcal polysaccharide vaccine 23-valent greater than or equal to 2yo subcutaneous/IM (Completed)

## 2017-03-12 NOTE — Patient Instructions (Addendum)
Try to fit in exercise when you can    It is time to get your first mammogram  Make sure to get this summer with next gyn appt  Pneumonia vaccine today   For cholesterol Avoid red meat/ fried foods/ egg yolks/ fatty breakfast meats/ butter, cheese and high fat dairy/ and shellfish     Take a look at the info on plantar fasciitis

## 2017-03-13 DIAGNOSIS — E669 Obesity, unspecified: Secondary | ICD-10-CM | POA: Insufficient documentation

## 2017-03-13 NOTE — Assessment & Plan Note (Signed)
Reviewed health habits including diet and exercise and skin cancer prevention Reviewed appropriate screening tests for age  Also reviewed health mt list, fam hx and immunization status , as well as social and family history   See HPI Sent for gyn note Plans to get first mammogram this summer at routine gyn visit (they have machine in the office)  Disc diet/exercise Wellness labs today  Pneumovax today - hx of asthma

## 2017-03-13 NOTE — Assessment & Plan Note (Signed)
Discussed how this problem influences overall health and the risks it imposes  Reviewed plan for weight loss with lower calorie diet (via better food choices and also portion control or program like weight watchers) and exercise building up to or more than 30 minutes 5 days per week including some aerobic activity    

## 2017-03-13 NOTE — Assessment & Plan Note (Signed)
Doing ok with topical tx

## 2017-03-13 NOTE — Assessment & Plan Note (Signed)
Continues folic acid  Disc risk factors for vasc dz Lipid panel today  Rev diet/exercise habits

## 2017-03-14 ENCOUNTER — Telehealth: Payer: Self-pay | Admitting: Family Medicine

## 2017-03-14 DIAGNOSIS — D72829 Elevated white blood cell count, unspecified: Secondary | ICD-10-CM | POA: Insufficient documentation

## 2017-03-14 NOTE — Telephone Encounter (Signed)
Order done

## 2017-03-21 ENCOUNTER — Ambulatory Visit: Payer: Self-pay | Admitting: Surgery

## 2017-03-21 NOTE — H&P (Signed)
History of Present Illness Erica Rollins(Jhada Risk M. Tex Conroy MD; 03/03/2017 11:14 AM) Patient words: CC: Intermittent sharp RUQ pain; possible hernia as well HPI: Erica Rollins is a pleasant 42yoF with hx of asthma, anxiety and depression here today as a referral from her PCP for possible ventral hernia.  She describes a history of intermittent crampy/sharp right upper quadrant pain which is made worse with fatty foods.  This has been going on for many months if not years and she has altered her diet due to this.  Nothing makes the pain better aside from time.  The pain occasionally radiates to the mid epigastric region.  She has associated nausea with the pain but no emesis.  She denies history of fever/chills.  She does note a history of "colitis" - and recently spent extended period time in [place] where [place] was evaluated for this.  She had bloody diarrhea and workup there was significant for proctitis/distal colitis and she was referred to GI.  She states she had a colonoscopy done that demonstrated "colitis" and was also told she had IBS. PMH: Rheumatoid arthritis, lupus, chronic anemia, vertigo, migraines PSH: Laparoscopic appendectomy-2009; tubal ligation 1993; C-section via Pfannenstiel 1989 FHx: Denies FHx of malignancy - Father deceased from suicide Social: Denies use of tobacco/EtOH/drugs ROS: A comprehensive 10 system review of systems was completed with the patient and pertinent findings as noted above.  The patient is a 42 year old female.   Past Surgical History Doristine Devoid(Chemira Jones, CMA; 03/03/2017 10:40 AM) Appendectomy   Cesarean Section - 1    Diagnostic Studies History Doristine Devoid(Chemira Jones, CMA; 03/03/2017 10:40 AM) Colonoscopy   within last year Mammogram   within last year Pap Smear   1-5 years ago  Allergies Doristine Devoid(Chemira Jones, CMA; 03/03/2017 10:42 AM) Anesthetics, Amide   Trazodone and Nefazodone   Benadryl *ANTIHISTAMINES*   Effexor *ANTIDEPRESSANTS*   Nortriptyline HCl *ANTIDEPRESSANTS*     Medication History (Chemira Jones, CMA; 03/03/2017 10:43 AM) Oxycodone-Acetaminophen  (10-325MG  Tablet, Oral) Active. Naproxen  (500MG  Tablet, Oral) Active. Fluticasone Propionate  (50MCG/ACT Suspension, Nasal) Active. Ferrous Sulfate  (325 (65 Fe)MG Tablet, Oral) Active. Medications Reconciled   Social History Doristine Devoid(Chemira Jones, CMA; 03/03/2017 10:40 AM) Caffeine use   Coffee, Tea. No alcohol use   No drug use   Tobacco use   Never smoker.  Family History Doristine Devoid(Chemira Jones, CMA; 03/03/2017 10:40 AM) Alcohol Abuse   Father. Arthritis   Father. Depression   Father, Mother. Diabetes Mellitus   Family Members In General. Ischemic Bowel Disease   Father. Migraine Headache   Father, Mother.  Pregnancy / Birth History Doristine Devoid(Chemira Jones, CMA; 03/03/2017 10:40 AM) Age at menarche   10 years. Contraceptive History   Intrauterine device, Oral contraceptives. Gravida   4 Irregular periods   Maternal age   41-20 Para   3  Other Problems Doristine Devoid(Chemira Jones, CMA; 03/03/2017 10:40 AM) Anxiety Disorder   Arthritis   Cholelithiasis   Hemorrhoids   Migraine Headache   Other disease, cancer, significant illness      Review of Systems (Chemira Jones CMA; 03/03/2017 10:40 AM) General Not Present- Appetite Loss, Chills, Fatigue, Fever, Night Sweats, Weight Gain and Weight Loss. Skin Not Present- Change in Wart/Mole, Dryness, Hives, Jaundice, New Lesions, Non-Healing Wounds, Rash and Ulcer. HEENT Present- Seasonal Allergies and Sinus Pain. Not Present- Earache, Hearing Loss, Hoarseness, Nose Bleed, Oral Ulcers, Ringing in the Ears, Sore Throat, Visual Disturbances, Wears glasses/contact lenses and Yellow Eyes. Respiratory Not Present- Bloody sputum, Chronic Cough, Difficulty Breathing, Snoring  and Wheezing. Cardiovascular Not Present- Chest Pain, Difficulty Breathing Lying Down, Leg Cramps, Palpitations, Rapid Heart Rate, Shortness of Breath and Swelling of Extremities. Gastrointestinal Present- Excessive gas  and Hemorrhoids. Not Present- Abdominal Pain, Bloating, Bloody Stool, Change in Bowel Habits, Chronic diarrhea, Constipation, Difficulty Swallowing, Gets full quickly at meals, Indigestion, Nausea, Rectal Pain and Vomiting. Musculoskeletal Present- Joint Pain and Joint Stiffness. Not Present- Back Pain, Muscle Pain, Muscle Weakness and Swelling of Extremities. Neurological Present- Headaches. Not Present- Decreased Memory, Fainting, Numbness, Seizures, Tingling, Tremor, Trouble walking and Weakness. Psychiatric Present- Anxiety. Not Present- Bipolar, Change in Sleep Pattern, Depression, Fearful and Frequent crying. Endocrine Not Present- Cold Intolerance, Excessive Hunger, Hair Changes, Heat Intolerance, Hot flashes and New Diabetes. Hematology Not Present- Blood Thinners, Easy Bruising, Excessive bleeding, Gland problems, HIV and Persistent Infections.  Vitals (Chemira Jones CMA; 03/03/2017 10:41 AM) 03/03/2017 10:40 AM Weight: 153.8 lb   Height: 63 in  Body Surface Area: 1.73 m   Body Mass Index: 27.24 kg/m   Temp.: 98.4 F (Oral)    Pulse: 90 (Regular)    BP: 130/80 (Sitting, Left Arm, Standard)       Physical Exam Erica Rollins M. Tiandre Teall MD; 03/03/2017 11:15 AM) The physical exam findings are as follows: Note: Constitutional: No acute distress; conversant; no deformities Eyes: Moist conjunctiva; no lid lag; anicteric sclerae; pupils equal round and reactive to light Neck: Trachea midline; no palpable thyromegaly Lungs: Normal respiratory effort; no tactile fremitus CV: Regular rate and rhythm; no palpable thrill; no pitting edema GI: Abdomen soft, nontender, nondistended; no palpable hepatosplenomegaly; negative Murphy's sign. ?Supraumbilical defect/hernia - firmess in this region. No overlying skin changes. MSK: Normal gait; no clubbing/cyanosis Psychiatric: Appropriate affect; alert and oriented 3 Lymphatic: No palpable cervical or axillary lymphadenopathy    Assessment & Plan  Erica Rollins M. Devone Bonilla MD; 03/03/2017 11:21 AM) VENTRAL HERNIA (K43.9) Impression: Erica Rollins is a pleasant 42yoF with hx of RA, Lupus, chronic anemia, "colitis" here today with likely underlying symptomatic cholelithiasis and possible supraumbilical ventral hernia -Will plan to obtain CT a/p with contrast for further evaluation of the abdominal wall -Return to office following CT scan for further evaluation and coordination/planning of care -Will likely plan laparoscopic versus open cholecystectomy for her symptomatic cholelithiasis - RUQ [place] 05/02/16 showed stones and sludge; her LFTs have been normal as has her WBC; she has no abnormal lipases levels in our system either. IF she has a ventral hernia, we will likely plan primary repair of that time of her cholecystectomy and avoid the placement of mesh -The anatomy and physiology of the HPB tract was discussed at length with the patient. The pathophysiology of ventral hernias and gallbladder disease was discussed at length with associated pictures and illustrations. The potential planned procedures for dressing hernias and gallbladder disease was discussed at length with pictures and diagrams. Minimally invasive as well as open techniques were discussed. -The planned procedure, material risks (including, but not limited to, pain, bleeding, infection, scarring, need for blood transfusion, damage to surrounding structures- blood vessels/nerves/viscus/organs, damage to bile duct, bile leak, need for additional procedures, hernia & recurrence of hernia, pancreatitis, pneumonia, heart attack, stroke, death) benefits and alternatives to surgery were discussed at length. I noted a good probability that the procedure would help improve their symptoms   Signed electronically by Andria Meuse, MD (03/03/2017 11:21 AM)

## 2017-03-27 ENCOUNTER — Encounter (HOSPITAL_COMMUNITY): Payer: Self-pay | Admitting: *Deleted

## 2017-04-04 ENCOUNTER — Encounter: Payer: Self-pay | Admitting: Family Medicine

## 2017-04-11 ENCOUNTER — Other Ambulatory Visit (INDEPENDENT_AMBULATORY_CARE_PROVIDER_SITE_OTHER): Payer: BC Managed Care – PPO

## 2017-04-11 DIAGNOSIS — D72829 Elevated white blood cell count, unspecified: Secondary | ICD-10-CM

## 2017-04-11 LAB — CBC WITH DIFFERENTIAL/PLATELET
Basophils Absolute: 0.1 10*3/uL (ref 0.0–0.1)
Basophils Relative: 0.8 % (ref 0.0–3.0)
EOS PCT: 2.1 % (ref 0.0–5.0)
Eosinophils Absolute: 0.2 10*3/uL (ref 0.0–0.7)
HEMATOCRIT: 40.3 % (ref 36.0–46.0)
HEMOGLOBIN: 13.7 g/dL (ref 12.0–15.0)
LYMPHS PCT: 30.2 % (ref 12.0–46.0)
Lymphs Abs: 2.8 10*3/uL (ref 0.7–4.0)
MCHC: 33.9 g/dL (ref 30.0–36.0)
MCV: 88.6 fl (ref 78.0–100.0)
MONOS PCT: 6.1 % (ref 3.0–12.0)
Monocytes Absolute: 0.6 10*3/uL (ref 0.1–1.0)
Neutro Abs: 5.6 10*3/uL (ref 1.4–7.7)
Neutrophils Relative %: 60.8 % (ref 43.0–77.0)
Platelets: 264 10*3/uL (ref 150.0–400.0)
RBC: 4.55 Mil/uL (ref 3.87–5.11)
RDW: 13.1 % (ref 11.5–15.5)
WBC: 9.3 10*3/uL (ref 4.0–10.5)

## 2017-04-25 ENCOUNTER — Inpatient Hospital Stay (HOSPITAL_COMMUNITY): Admission: RE | Admit: 2017-04-25 | Payer: Self-pay | Source: Ambulatory Visit

## 2017-04-29 ENCOUNTER — Ambulatory Visit: Payer: Self-pay | Admitting: Surgery

## 2017-04-30 ENCOUNTER — Ambulatory Visit: Admit: 2017-04-30 | Payer: BC Managed Care – PPO | Admitting: Surgery

## 2017-04-30 SURGERY — LAPAROSCOPIC CHOLECYSTECTOMY
Anesthesia: General

## 2017-06-05 ENCOUNTER — Encounter: Payer: Self-pay | Admitting: Family Medicine

## 2017-11-03 ENCOUNTER — Ambulatory Visit: Payer: BC Managed Care – PPO | Admitting: Family Medicine

## 2017-11-03 ENCOUNTER — Encounter: Payer: Self-pay | Admitting: Family Medicine

## 2017-11-03 DIAGNOSIS — J0100 Acute maxillary sinusitis, unspecified: Secondary | ICD-10-CM | POA: Insufficient documentation

## 2017-11-03 DIAGNOSIS — J019 Acute sinusitis, unspecified: Secondary | ICD-10-CM | POA: Insufficient documentation

## 2017-11-03 MED ORDER — PREDNISONE 10 MG PO TABS: ORAL_TABLET | ORAL | 0 refills | Status: DC

## 2017-11-03 MED ORDER — AMOXICILLIN-POT CLAVULANATE 875-125 MG PO TABS: 1.0000 | ORAL_TABLET | Freq: Two times a day (BID) | ORAL | 0 refills | Status: DC

## 2017-11-03 MED ORDER — ALBUTEROL SULFATE HFA 108 (90 BASE) MCG/ACT IN AERS: 2.0000 | INHALATION_SPRAY | Freq: Four times a day (QID) | RESPIRATORY_TRACT | 5 refills | Status: AC | PRN

## 2017-11-03 NOTE — Patient Instructions (Signed)
Take the augmentin for sinus infection  Drink fluids and rest  Nasal saline spray for congestion  mucinex for congestion   Continue inhaler as needed  If wheezing worsens or fails to improve-fill the prednisone px and keep [place] posted

## 2017-11-03 NOTE — Assessment & Plan Note (Signed)
S/p viral uri  Disc symptomatic care - see instructions on AVS  Px augmentin bid for 7d Refilled inhaler for wheezing Given px for prednisone 30 mg taper to start if wheezing or congestion worsen or fail to imp (aware of potential side effects)  Fluids/rest Update if not starting to improve in a week or if worsening

## 2017-11-03 NOTE — Progress Notes (Signed)
Subjective:    Patient ID: Erica Rollins, female    DOB: Aug 22, 1975, 42 y.o.   MRN: [id number]  HPI Here for upper resp symptoms for a week- congestion and cough  Wt Readings from Last 3 Encounters:  11/03/17 216 lb 12 oz (98.3 kg)  03/12/17 228 lb 8 oz (103.6 kg)  12/23/15 221 lb (100.2 kg)   Wt is down   Last Saturday-had a fever (101.7)  pnd and ST Then increasing head pressure/pain in face  Bad congestion  Nasal d/c -cannot get much out (yellow /green)   Cough - is in throat (not very prod)  A little wheezing when she lies down  Has an inhaler   Temp: 98.1 F (36.7 C)    Has seasonal allergies Took tylenol severe cold- and then changed to severe sinus   Delsym- at night  Sleeps fairly   Patient Active Problem List   Diagnosis Date Noted  . Acute sinusitis 11/03/2017  . Leukocytosis 03/14/2017  . Obesity (BMI 30-39.9) 03/13/2017  . Seasonal allergies 03/21/2014  . Asthma, mild 03/21/2014  . Routine general medical examination at a health care facility 03/21/2014  . Rosacea 03/21/2014  . 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 Allergy   . Asthma, mild   . High plasma total homocysteine   . History of foot fracture   . Rosacea    Past Surgical History:  Procedure Laterality Date  . APPENDECTOMY    . CESAREAN SECTION  2007   Social History   Tobacco Use  . Smoking status: Never Smoker  . Smokeless tobacco: Never Used  Substance Use Topics  . Alcohol use: No    Alcohol/week: 0.0 standard drinks  . Drug use: No   Family History  Problem Relation Age of Onset  . Cancer Mother   . Stroke Paternal Uncle   . Arthritis Maternal Grandmother   . Stroke Paternal Grandmother   . Heart disease Paternal Grandfather    No Known Allergies Current Outpatient Medications on File Prior to Visit  Medication Sig Dispense Refill  . Ascorbic Acid (VITAMIN C PO) Take 1 capsule by mouth daily.    . Calcium  Carbonate-Vitamin D (CALCIUM + D PO) Take 1 capsule by mouth daily.    . fexofenadine (ALLEGRA) 180 MG tablet Take 180 mg by mouth daily.    Marland Kitchen FIBER PO Take 4 g by mouth daily.     Marland Kitchen FOLIC ACID PO Take 1 tablet by mouth daily.    Marland Kitchen PORTIA-28 0.15-30 MG-MCG tablet     . Prenatal Vit-Fe Fumarate-FA (MULTIVITAMIN-PRENATAL) 27-0.8 MG TABS tablet Take 1 tablet by mouth daily at 12 noon.    . naproxen sodium (ALEVE) 220 MG tablet Take 2 tablets by mouth daily as needed.     No current facility-administered medications on file prior to visit.     Review of Systems  Constitutional: Positive for appetite change. Negative for fatigue and fever.       Fever is gone   HENT: Positive for congestion, ear pain, postnasal drip, rhinorrhea, sinus pressure and sore throat. Negative for nosebleeds.   Eyes: Negative for pain, redness and itching.  Respiratory: Positive for cough. Negative for shortness of breath and wheezing.   Cardiovascular: Negative for chest pain.  Gastrointestinal: Negative for abdominal pain, diarrhea, nausea and vomiting.  Endocrine: Negative for polyuria.  Genitourinary: Negative for dysuria, frequency and urgency.  Musculoskeletal: Negative for arthralgias and myalgias.  Allergic/Immunologic:  Negative for immunocompromised state.  Neurological: Positive for headaches. Negative for dizziness, tremors, syncope, weakness and numbness.  Hematological: Negative for adenopathy. Does not bruise/bleed easily.  Psychiatric/Behavioral: Negative for dysphoric mood. The patient is not nervous/anxious.        Objective:   Physical Exam  Constitutional: She appears well-developed and well-nourished. No distress.  obese and well appearing  Wt loss noted  HENT:  Head: Normocephalic and atraumatic.  Right Ear: External ear normal.  Left Ear: External ear normal.  Mouth/Throat: Oropharynx is clear and moist. No oropharyngeal exudate.  Nares are injected and congested  Bilateral maxillary  sinus tenderness  Post nasal drip   Eyes: Pupils are equal, round, and reactive to light. Conjunctivae and EOM are normal. Right eye exhibits no discharge. Left eye exhibits no discharge.  Neck: Normal range of motion. Neck supple.  Cardiovascular: Normal rate and regular rhythm.  Pulmonary/Chest: Effort normal. No stridor. No respiratory distress. She has wheezes. She has no rales.  Scattered mild exp wheezes  Barky cough  Some upper airway sounds  No rales or rhonchi   Lymphadenopathy:    She has no cervical adenopathy.  Neurological: She is alert. No cranial nerve deficit.  Skin: Skin is warm and dry. No rash noted.  Psychiatric: She has a normal mood and affect.  Pleasant           Assessment & Plan:   Problem List Items Addressed This Visit      Respiratory   Acute sinusitis    S/p viral uri  Disc symptomatic care - see instructions on AVS  Px augmentin bid for 7d Refilled inhaler for wheezing Given px for prednisone 30 mg taper to start if wheezing or congestion worsen or fail to imp (aware of potential side effects)  Fluids/rest Update if not starting to improve in a week or if worsening        Relevant Medications   amoxicillin-clavulanate (AUGMENTIN) 875-125 MG tablet   predniSONE (DELTASONE) 10 MG tablet

## 2018-01-19 ENCOUNTER — Encounter: Payer: Self-pay | Admitting: Family Medicine

## 2018-01-19 ENCOUNTER — Ambulatory Visit: Payer: BC Managed Care – PPO | Admitting: Family Medicine

## 2018-01-19 VITALS — BP 136/92 | HR 83 | Temp 98.3°F | Ht 63.5 in | Wt 217.5 lb

## 2018-01-19 DIAGNOSIS — J0100 Acute maxillary sinusitis, unspecified: Secondary | ICD-10-CM

## 2018-01-19 MED ORDER — BENZONATATE 200 MG PO CAPS: 200.0000 mg | ORAL_CAPSULE | Freq: Three times a day (TID) | ORAL | 1 refills | Status: DC | PRN

## 2018-01-19 MED ORDER — AMOXICILLIN-POT CLAVULANATE 875-125 MG PO TABS: 1.0000 | ORAL_TABLET | Freq: Two times a day (BID) | ORAL | 0 refills | Status: DC

## 2018-01-19 NOTE — Progress Notes (Signed)
Subjective:    Patient ID: Erica Rollins, female    DOB: Jun 01, 1975, 42 y.o.   MRN: [id number]  HPI Here for uri symptoms   Scratchy throat - mid last week  Headache- across forehead Congestion- green nasal d/c Cough- phlegm is yellow and green  A little wheeze- used inhaler once yesterday  Ears are popping/crackling /full --R one hurt a bit   No fever  No chills/body aches   Dislikes nasal spray Took tylenol cold and mucinex pm   Patient Active Problem List   Diagnosis Date Noted  . Acute sinusitis 11/03/2017  . Leukocytosis 03/14/2017  . Obesity (BMI 30-39.9) 03/13/2017  . Seasonal allergies 03/21/2014  . Asthma, mild 03/21/2014  . Routine general medical examination at a health care facility 03/21/2014  . Rosacea 03/21/2014  . 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 Allergy   . Asthma, mild   . High plasma total homocysteine   . History of foot fracture   . Rosacea    Past Surgical History:  Procedure Laterality Date  . APPENDECTOMY    . CESAREAN SECTION  2007   Social History   Tobacco Use  . Smoking status: Never Smoker  . Smokeless tobacco: Never Used  Substance Use Topics  . Alcohol use: No    Alcohol/week: 0.0 standard drinks  . Drug use: No   Family History  Problem Relation Age of Onset  . Cancer Mother   . Stroke Paternal Uncle   . Arthritis Maternal Grandmother   . Stroke Paternal Grandmother   . Heart disease Paternal Grandfather    No Known Allergies Current Outpatient Medications on File Prior to Visit  Medication Sig Dispense Refill  . albuterol (PROVENTIL HFA;VENTOLIN HFA) 108 (90 Base) MCG/ACT inhaler Inhale 2 puffs into the lungs every 6 (six) hours as needed for wheezing or shortness of breath. 1 Inhaler 5  . Ascorbic Acid (VITAMIN C PO) Take 1 capsule by mouth daily.    . Calcium Carbonate-Vitamin D (CALCIUM + D PO) Take 1 capsule by mouth daily.    . fexofenadine (ALLEGRA) 180 MG  tablet Take 180 mg by mouth daily.    Marland Kitchen FIBER PO Take 4 g by mouth daily.     Marland Kitchen FOLIC ACID PO Take 1 tablet by mouth daily.    . naproxen sodium (ALEVE) 220 MG tablet Take 2 tablets by mouth daily as needed.    Marland Kitchen PORTIA-28 0.15-30 MG-MCG tablet     . Prenatal Vit-Fe Fumarate-FA (MULTIVITAMIN-PRENATAL) 27-0.8 MG TABS tablet Take 1 tablet by mouth daily at 12 noon.     No current facility-administered medications on file prior to visit.     Review of Systems  Constitutional: Positive for appetite change. Negative for fatigue and fever.  HENT: Positive for congestion, ear pain, postnasal drip, rhinorrhea, sinus pressure and sore throat. Negative for nosebleeds.   Eyes: Negative for pain, redness and itching.  Respiratory: Positive for cough. Negative for shortness of breath and wheezing.   Cardiovascular: Negative for chest pain.  Gastrointestinal: Negative for abdominal pain, diarrhea, nausea and vomiting.  Endocrine: Negative for polyuria.  Genitourinary: Negative for dysuria, frequency and urgency.  Musculoskeletal: Negative for arthralgias and myalgias.  Allergic/Immunologic: Negative for immunocompromised state.  Neurological: Positive for headaches. Negative for dizziness, tremors, syncope, weakness and numbness.  Hematological: Negative for adenopathy. Does not bruise/bleed easily.  Psychiatric/Behavioral: Negative for dysphoric mood. The patient is not nervous/anxious.  Objective:   Physical Exam Constitutional:      General: She is not in acute distress.    Appearance: She is well-developed. She is obese. She is not ill-appearing.  HENT:     Head: Normocephalic and atraumatic.     Right Ear: Ear canal and external ear normal. Tympanic membrane is not erythematous.     Left Ear: Ear canal and external ear normal. Tympanic membrane is not erythematous.     Ears:     Comments: TMs are dull No erythema or bulging  Bilateral frontal and maxillary sinus tenderness (marked)   Nares are injected and congested      Nose: Congestion present. No rhinorrhea.     Mouth/Throat:     Mouth: Mucous membranes are moist. No oral lesions.     Pharynx: No oropharyngeal exudate or posterior oropharyngeal erythema.  Eyes:     General:        Right eye: No discharge.        Left eye: No discharge.     Conjunctiva/sclera: Conjunctivae normal.     Pupils: Pupils are equal, round, and reactive to light.  Neck:     Musculoskeletal: Normal range of motion and neck supple.  Cardiovascular:     Rate and Rhythm: Normal rate and regular rhythm.  Pulmonary:     Effort: Pulmonary effort is normal. No respiratory distress.     Breath sounds: Normal breath sounds. No wheezing or rales.  Lymphadenopathy:     Cervical: No cervical adenopathy.  Skin:    General: Skin is warm and dry.     Findings: No rash.  Neurological:     Mental Status: She is alert.     Cranial Nerves: No cranial nerve deficit.           Assessment & Plan:   Problem List Items Addressed This Visit      Respiratory   Acute sinusitis - Primary    In pt prone to sinusitis -with uri symptoms for 5 days  Fluids/rest Tessalon for cough augmentin as directed  If wheezing worsens-pt has a course of prednisone at home (30 mg taper) to take and let us know  mucinex or mucinex DM She does not tolerate nasal sprays  Update if not starting to improve in a week or if worsening        Relevant Medications   amoxicillin-clavulanate (AUGMENTIN) 875-125 MG tablet   benzonatate (TESSALON) 200 MG capsule

## 2018-01-19 NOTE — Assessment & Plan Note (Signed)
In pt prone to sinusitis -with uri symptoms for 5 days  Fluids/rest Tessalon for cough augmentin as directed  If wheezing worsens-pt has a course of prednisone at home (30 mg taper) to take and let us know  mucinex or mucinex DM She does not tolerate nasal sprays  Update if not starting to improve in a week or if worsening

## 2018-01-19 NOTE — Patient Instructions (Addendum)
Take augmentin as directed Fluids/rest as much as you can   If wheezing worsens-take the prednisone you have on hand and let us know   Use inhaler as needed  Plain mucinex (or mucinex DM) for congestion  (the DM is for cough)   Try tessalon for cough   Update if not starting to improve in a week or if worsening

## 2018-09-08 ENCOUNTER — Encounter: Payer: Self-pay | Admitting: Family Medicine

## 2018-10-15 ENCOUNTER — Telehealth: Payer: Self-pay

## 2018-10-15 NOTE — Telephone Encounter (Signed)
I reviewed her allergist note from [facility] like her asthma is mild and intermittent.  It looks like she needs albuterol just when she is sick.  They px singulair at last visit to take daily  Please ask how bad her symptoms get when she does get sick ? 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  In terms of her risks- I recommend she check in with them since they treat her condition.

## 2018-10-15 NOTE — Telephone Encounter (Signed)
Left VM letting pt know Dr. Marliss Coots comments. I requested pt to call back if she wants to discuss asthma and how bad her sxs get when she is sick but I also advised her to check in with allergist since they are the office that manages asthma care

## 2018-10-15 NOTE — Telephone Encounter (Signed)
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 Pt wants to know due to her health condition of asthma if needs to continue working from home. If so pt will get necessary paperwork for pt to continue working from home. Pt request cb. Last seen for acute on 01/19/18 and last annual on 03/22/17. No future appt scheduled.Please advise.

## 2019-02-26 ENCOUNTER — Telehealth: Payer: Self-pay

## 2019-02-26 NOTE — Telephone Encounter (Signed)
Patient l/m on triage line stating that her co worker just told her that she was tested positive for COVID. Patient was around this co worker on 02/23/19 for about 25 minutes with mask on in a room. Co worker started to have symptoms on 02/23/19. Co worker was unknowingly exposed to someone with COVID on 02/21/19. Patient is wanting to get some guidance on what she needs to do, she does not have any symptoms at this time. Wants to know if she does develops symptoms does she need to get tested or what recommendations do we have.

## 2019-02-26 NOTE — Telephone Encounter (Signed)
Since it sounds like she was around someone with an active infection - I do recommend covid testing 5 or more days later (or earlier if she herself develops symptoms)  Isolate until a result the best she can  Since they were both masked -that is re assuring and chances of infection are reduced

## 2019-02-26 NOTE — Telephone Encounter (Signed)
Pt notified of Dr. Royden Purl instructions and verbalized understanding. Testing info provided to pt she will schedule an appt on day 5 from exposure

## 2019-09-09 ENCOUNTER — Encounter: Payer: Self-pay | Admitting: Family Medicine

## 2019-09-23 ENCOUNTER — Telehealth: Payer: Self-pay | Admitting: Family Medicine

## 2019-09-23 DIAGNOSIS — Z0000 Encounter for general adult medical examination without abnormal findings: Secondary | ICD-10-CM

## 2019-09-23 NOTE — Telephone Encounter (Signed)
-----   Message from Aquilla Solian, RT sent at 09/22/2019  9:48 AM EDT ----- Regarding: Lab Orders for Friday 8.20.2021 Please place lab orders for  Friday 8.20.2021, office visit for physical on Friday 9.3..2021 Thank you, Jones Bales RT(R)

## 2019-09-24 ENCOUNTER — Other Ambulatory Visit: Payer: Self-pay

## 2019-09-24 ENCOUNTER — Other Ambulatory Visit (INDEPENDENT_AMBULATORY_CARE_PROVIDER_SITE_OTHER): Payer: BC Managed Care – PPO

## 2019-09-24 DIAGNOSIS — Z0000 Encounter for general adult medical examination without abnormal findings: Secondary | ICD-10-CM | POA: Diagnosis not present

## 2019-09-24 LAB — CBC WITH DIFFERENTIAL/PLATELET
Basophils Absolute: 0.1 10*3/uL (ref 0.0–0.1)
Basophils Relative: 0.7 % (ref 0.0–3.0)
Eosinophils Absolute: 0.1 10*3/uL (ref 0.0–0.7)
Eosinophils Relative: 1.8 % (ref 0.0–5.0)
HCT: 42.8 % (ref 36.0–46.0)
Hemoglobin: 14.6 g/dL (ref 12.0–15.0)
Lymphocytes Relative: 26.8 % (ref 12.0–46.0)
Lymphs Abs: 2.1 10*3/uL (ref 0.7–4.0)
MCHC: 34.1 g/dL (ref 30.0–36.0)
MCV: 89.9 fl (ref 78.0–100.0)
Monocytes Absolute: 0.5 10*3/uL (ref 0.1–1.0)
Monocytes Relative: 6.2 % (ref 3.0–12.0)
Neutro Abs: 5 10*3/uL (ref 1.4–7.7)
Neutrophils Relative %: 64.5 % (ref 43.0–77.0)
Platelets: 207 10*3/uL (ref 150.0–400.0)
RBC: 4.76 Mil/uL (ref 3.87–5.11)
RDW: 13.1 % (ref 11.5–15.5)
WBC: 7.8 10*3/uL (ref 4.0–10.5)

## 2019-09-24 LAB — COMPREHENSIVE METABOLIC PANEL
ALT: 49 U/L — ABNORMAL HIGH (ref 0–35)
AST: 24 U/L (ref 0–37)
Albumin: 3.9 g/dL (ref 3.5–5.2)
Alkaline Phosphatase: 55 U/L (ref 39–117)
BUN: 12 mg/dL (ref 6–23)
CO2: 24 mEq/L (ref 19–32)
Calcium: 9 mg/dL (ref 8.4–10.5)
Chloride: 106 mEq/L (ref 96–112)
Creatinine, Ser: 0.89 mg/dL (ref 0.40–1.20)
GFR: 69.22 mL/min (ref >60.00)
Glucose, Bld: 89 mg/dL (ref 70–99)
Potassium: 4.4 mEq/L (ref 3.5–5.1)
Sodium: 139 mEq/L (ref 135–145)
Total Bilirubin: 0.6 mg/dL (ref 0.2–1.2)
Total Protein: 6.4 g/dL (ref 6.0–8.3)

## 2019-09-24 LAB — LIPID PANEL
Cholesterol: 178 mg/dL (ref 0–200)
HDL: 33.6 mg/dL — ABNORMAL LOW (ref >39.00)
LDL Cholesterol: 124 mg/dL — ABNORMAL HIGH (ref 0–99)
NonHDL: 144.02
Total CHOL/HDL Ratio: 5
Triglycerides: 102 mg/dL (ref 0.0–149.0)
VLDL: 20.4 mg/dL (ref 0.0–40.0)

## 2019-09-24 LAB — TSH: TSH: 1.83 u[IU]/mL (ref 0.35–4.50)

## 2019-10-01 ENCOUNTER — Other Ambulatory Visit: Payer: BC Managed Care – PPO

## 2019-10-08 ENCOUNTER — Ambulatory Visit (INDEPENDENT_AMBULATORY_CARE_PROVIDER_SITE_OTHER): Payer: BC Managed Care – PPO | Admitting: Family Medicine

## 2019-10-08 ENCOUNTER — Other Ambulatory Visit: Payer: Self-pay

## 2019-10-08 ENCOUNTER — Encounter: Payer: Self-pay | Admitting: Family Medicine

## 2019-10-08 VITALS — BP 128/64 | HR 73 | Temp 97.7°F | Ht 63.25 in | Wt 190.5 lb

## 2019-10-08 DIAGNOSIS — E7211 Homocystinuria: Secondary | ICD-10-CM

## 2019-10-08 DIAGNOSIS — L719 Rosacea, unspecified: Secondary | ICD-10-CM

## 2019-10-08 DIAGNOSIS — Z0000 Encounter for general adult medical examination without abnormal findings: Secondary | ICD-10-CM | POA: Diagnosis not present

## 2019-10-08 DIAGNOSIS — E669 Obesity, unspecified: Secondary | ICD-10-CM

## 2019-10-08 DIAGNOSIS — R7401 Elevation of levels of liver transaminase levels: Secondary | ICD-10-CM

## 2019-10-08 MED ORDER — METRONIDAZOLE 0.75 % EX LOTN: TOPICAL_LOTION | CUTANEOUS | 3 refills | Status: AC

## 2019-10-08 NOTE — Patient Instructions (Addendum)
To raise your HDL -exercise  Also get omega 3 supplement take as directed  Also eat fish (with fins)   One liver enzyme test is mildly elevated  If you develop abdominal pain - let us know  Avoid tylenol and alcohol   Drink lots of water Keep up the good work with weight loss  Get a flu shot when you can

## 2019-10-08 NOTE — Progress Notes (Signed)
Subjective:    Patient ID: Erica Rollins, female    DOB: [birth year], 44 y.o.   MRN: [id number]  This visit occurred during the SARS-CoV-2 public health emergency.  Safety protocols were in place, including screening questions prior to the visit, additional usage of staff PPE, and extensive cleaning of exam room while observing appropriate contact time as indicated for disinfecting solutions.    HPI Here for health maintenance exam and to review chronic medical problems    Wt Readings from Last 3 Encounters:  10/08/19 190 lb 8 oz (86.4 kg)  01/19/18 217 lb 8 oz (98.7 kg)  11/03/17 216 lb 12 oz (98.3 kg)  working on it  opta via since may  Doing very well   33.48 kg/m   Exercise - walked daily  Now back at work- harder to fit in walk    Immunized for [employer] Tdap 2/16 pna 23- 2019 Flu shot -will get it at [employer] with daughter     Melene Plan to [facility] gyn- unsure when last pap was  Just had a visit a mo ago  Takes OC monophasic -tries to not have a period Menstrual hx   mammogram 8/21 Self breast exam - no lumps   BP Readings from Last 3 Encounters:  10/08/19 128/64  01/19/18 (!) 136/92  11/03/17 132/80   Pulse Readings from Last 3 Encounters:  10/08/19 73  01/19/18 83  11/03/17 72    Takes folic acid for elevated homocystein  No leukocytosis this visit Lab Results  Component Value Date   WBC 7.8 09/24/2019   HGB 14.6 09/24/2019   HCT 42.8 09/24/2019   MCV 89.9 09/24/2019   PLT 207.0 09/24/2019     Cholesterol Lab Results  Component Value Date   CHOL 178 09/24/2019   CHOL 189 03/12/2017   CHOL 185 03/21/2014   Lab Results  Component Value Date   HDL 33.60 (L) 09/24/2019   HDL 43.30 03/12/2017   HDL 45.40 03/21/2014   Lab Results  Component Value Date   LDLCALC 124 (H) 09/24/2019   LDLCALC 122 (H) 03/12/2017   LDLCALC 118 (H) 03/21/2014   Lab Results  Component Value Date   TRIG 102.0 09/24/2019   TRIG 120.0 03/12/2017   TRIG 106.0  03/21/2014   Lab Results  Component Value Date   CHOLHDL 5 09/24/2019   CHOLHDL 4 03/12/2017   CHOLHDL 4 03/21/2014   No results found for: LDLDIRECT Needs to eat more fish  Likes salmon  Does walk   Father - heart dz in 92s    Other labs   Lab Results  Component Value Date   CREATININE 0.89 09/24/2019   BUN 12 09/24/2019   NA 139 09/24/2019   K 4.4 09/24/2019   CL 106 09/24/2019   CO2 24 09/24/2019   Lab Results  Component Value Date   ALT 49 (H) 09/24/2019   AST 24 09/24/2019   ALKPHOS 55 09/24/2019   BILITOT 0.6 09/24/2019   no abd pain occ tylenol No alcohol     Glucose 89 Lab Results  Component Value Date   TSH 1.83 09/24/2019     Wants to try something for rosacea - mask makes it worse   Patient Active Problem List   Diagnosis Date Noted  . Elevated ALT measurement 10/10/2019  . Obesity (BMI 30-39.9) 03/13/2017  . Seasonal allergies 03/21/2014  . Asthma, mild 03/21/2014  . Routine general medical examination at a health care facility 03/21/2014  . Rosacea 03/21/2014  .  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 Allergy   . Asthma, mild   . High plasma total homocysteine   . History of foot fracture   . Rosacea    Past Surgical History:  Procedure Laterality Date  . APPENDECTOMY    . CESAREAN SECTION  2007   Social History   Tobacco Use  . Smoking status: Never Smoker  . Smokeless tobacco: Never Used  Substance Use Topics  . Alcohol use: No    Alcohol/week: 0.0 standard drinks  . Drug use: No   Family History  Problem Relation Age of Onset  . Cancer Mother   . Stroke Paternal Uncle   . Arthritis Maternal Grandmother   . Stroke Paternal Grandmother   . Heart disease Paternal Grandfather    No Known Allergies Current Outpatient Medications on File Prior to Visit  Medication Sig Dispense Refill  . albuterol (PROVENTIL HFA;VENTOLIN HFA) 108 (90 Base) MCG/ACT inhaler Inhale 2 puffs into  the lungs every 6 (six) hours as needed for wheezing or shortness of breath. 1 Inhaler 5  . Ascorbic Acid (VITAMIN C PO) Take 1 capsule by mouth daily.    . Calcium Carbonate-Vitamin D (CALCIUM + D PO) Take 1 capsule by mouth daily.    . fexofenadine (ALLEGRA) 180 MG tablet Take 180 mg by mouth daily.    Marland Kitchen FIBER PO Take 4 g by mouth daily.     Marland Kitchen FOLIC ACID PO Take 1 tablet by mouth daily.    . naproxen sodium (ALEVE) 220 MG tablet Take 2 tablets by mouth daily as needed.    Marland Kitchen PORTIA-28 0.15-30 MG-MCG tablet     . Prenatal Vit-Fe Fumarate-FA (MULTIVITAMIN-PRENATAL) 27-0.8 MG TABS tablet Take 1 tablet by mouth daily at 12 noon.     No current facility-administered medications on file prior to visit.    Review of Systems  Constitutional: Negative for activity change, appetite change, fatigue, fever and unexpected weight change.  HENT: Negative for congestion, ear pain, rhinorrhea, sinus pressure and sore throat.   Eyes: Negative for pain, redness and visual disturbance.  Respiratory: Negative for cough, shortness of breath and wheezing.   Cardiovascular: Negative for chest pain and palpitations.  Gastrointestinal: Negative for abdominal pain, blood in stool, constipation and diarrhea.  Endocrine: Negative for polydipsia and polyuria.  Genitourinary: Negative for dysuria, frequency and urgency.  Musculoskeletal: Negative for arthralgias, back pain and myalgias.  Skin: Negative for pallor and rash.  Allergic/Immunologic: Negative for environmental allergies.  Neurological: Negative for dizziness, syncope and headaches.  Hematological: Negative for adenopathy. Does not bruise/bleed easily.  Psychiatric/Behavioral: Negative for decreased concentration and dysphoric mood. The patient is not nervous/anxious.        Objective:   Physical Exam Constitutional:      General: She is not in acute distress.    Appearance: Normal appearance. She is well-developed. She is obese. She is not  ill-appearing or diaphoretic.  HENT:     Head: Normocephalic and atraumatic.     Right Ear: Tympanic membrane, ear canal and external ear normal.     Left Ear: Tympanic membrane, ear canal and external ear normal.     Nose: Nose normal. No congestion.     Mouth/Throat:     Mouth: Mucous membranes are moist.     Pharynx: Oropharynx is clear. No posterior oropharyngeal erythema.  Eyes:     General: No scleral icterus.    Extraocular Movements: Extraocular movements intact.  Conjunctiva/sclera: Conjunctivae normal.     Pupils: Pupils are equal, round, and reactive to light.  Neck:     Thyroid: No thyromegaly.     Vascular: No carotid bruit or JVD.  Cardiovascular:     Rate and Rhythm: Normal rate and regular rhythm.     Pulses: Normal pulses.     Heart sounds: Normal heart sounds. No gallop.   Pulmonary:     Effort: Pulmonary effort is normal. No respiratory distress.     Breath sounds: Normal breath sounds. No wheezing.     Comments: Good air exch Chest:     Chest wall: No tenderness.  Abdominal:     General: Bowel sounds are normal. There is no distension or abdominal bruit.     Palpations: Abdomen is soft. There is no mass.     Tenderness: There is no abdominal tenderness.     Hernia: No hernia is present.  Genitourinary:    Comments: Breast and pelvic exam done by gyn  Musculoskeletal:        General: No tenderness. Normal range of motion.     Cervical back: Normal range of motion and neck supple. No rigidity. No muscular tenderness.     Right lower leg: No edema.     Left lower leg: No edema.  Lymphadenopathy:     Cervical: No cervical adenopathy.  Skin:    General: Skin is warm and dry.     Coloration: Skin is not pale.     Findings: No erythema or rash.     Comments: Solar lentigines diffusely  Some changes or rosacea=flushing and papules on cheeks  Neurological:     Mental Status: She is alert. Mental status is at baseline.     Cranial Nerves: No cranial  nerve deficit.     Motor: No abnormal muscle tone.     Coordination: Coordination normal.     Gait: Gait normal.     Deep Tendon Reflexes: Reflexes are normal and symmetric. Reflexes normal.  Psychiatric:        Mood and Affect: Mood normal.        Cognition and Memory: Cognition and memory normal.           Assessment & Plan:   Problem List Items Addressed This Visit      Musculoskeletal and Integument   Rosacea    Trial of metro lotion and update  Enc to use sun protection         Other   Routine general medical examination at a health care facility - Primary    Reviewed health habits including diet and exercise and skin cancer prevention Reviewed appropriate screening tests for age  Also reviewed health mt list, fam hx and immunization status , as well as social and family history   See HPI Labs reviewed  Enc wt loss Planning to get flu shot at [employer] with her daughter  Is covid immunized  utd gyn care , encouraged self breast exams        Hyperhomocysteinemia (HCC)    Continues folic acid supplementation        Obesity (BMI 30-39.9)    Discussed how this problem influences overall health and the risks it imposes  Reviewed plan for weight loss with lower calorie diet (via better food choices and also portion control or program like weight watchers) and exercise building up to or more than 30 minutes 5 days per week including some aerobic activity   Fatty liver is  a possibiltiy      Elevated ALT measurement    Very mildly elevated at 49 Most likely from a fatty liver  No h/o hepatitis No abd pain or jaundice Disc limiting tylenol /alcohol Work on wt loss  Will continue to monitor

## 2019-10-10 DIAGNOSIS — R7401 Elevation of levels of liver transaminase levels: Secondary | ICD-10-CM | POA: Insufficient documentation

## 2019-10-10 NOTE — Assessment & Plan Note (Signed)
Trial of metro lotion and update  Enc to use sun protection

## 2019-10-10 NOTE — Assessment & Plan Note (Signed)
Continues folic acid supplementation

## 2019-10-10 NOTE — Assessment & Plan Note (Signed)
Discussed how this problem influences overall health and the risks it imposes  Reviewed plan for weight loss with lower calorie diet (via better food choices and also portion control or program like weight watchers) and exercise building up to or more than 30 minutes 5 days per week including some aerobic activity   Fatty liver is a possibiltiy

## 2019-10-10 NOTE — Assessment & Plan Note (Signed)
Very mildly elevated at 49 Most likely from a fatty liver  No h/o hepatitis No abd pain or jaundice Disc limiting tylenol /alcohol Work on wt loss  Will continue to monitor

## 2019-10-10 NOTE — Assessment & Plan Note (Signed)
Reviewed health habits including diet and exercise and skin cancer prevention Reviewed appropriate screening tests for age  Also reviewed health mt list, fam hx and immunization status , as well as social and family history   See HPI Labs reviewed  Enc wt loss Planning to get flu shot at [employer] with her daughter  Is covid immunized  utd gyn care , encouraged self breast exams

## 2020-01-25 ENCOUNTER — Telehealth (INDEPENDENT_AMBULATORY_CARE_PROVIDER_SITE_OTHER): Payer: BC Managed Care – PPO | Admitting: Family Medicine

## 2020-01-25 ENCOUNTER — Encounter: Payer: Self-pay | Admitting: Family Medicine

## 2020-01-25 ENCOUNTER — Other Ambulatory Visit: Payer: Self-pay

## 2020-01-25 DIAGNOSIS — J0100 Acute maxillary sinusitis, unspecified: Secondary | ICD-10-CM | POA: Diagnosis not present

## 2020-01-25 MED ORDER — AMOXICILLIN-POT CLAVULANATE 875-125 MG PO TABS: 1.0000 | ORAL_TABLET | Freq: Two times a day (BID) | ORAL | 0 refills | Status: DC

## 2020-01-25 NOTE — Progress Notes (Signed)
Virtual Visit via Video Note  I connected with [name] on 01/25/20 at 12:30 PM EST by a video enabled telemedicine application and verified that I am speaking with the correct person using two identifiers.  Location: Patient: home Provider: office   I discussed the limitations of evaluation and management by telemedicine and the availability of in person appointments. The patient expressed understanding and agreed to proceed.  Parties involved in encounter  Patient:Erica Rollins  Provider:  Roxy Manns MD    Subjective:    Patient ID: Erica Rollins, female    DOB: Apr 08, 1975, 44 y.o.   MRN: [id number]  This visit occurred during the SARS-CoV-2 public health emergency.  Safety protocols were in place, including screening questions prior to the visit, additional usage of staff PPE, and extensive cleaning of exam room while observing appropriate contact time as indicated for disinfecting solutions.    HPI  Pt presents with ST and congestion and hoarse voice  Last week- scratchy and sore throat  Next day throat was fine  Then pnd and hoarse voice   Now pressure headache in sinuses  Green mucous from throat/pnd  Nose is congested (up high)   Coughing -not a lot  Prod green phlegm  Worse when lying down  No wheezing  A little tight this am and better after shower  Not needed inhaler   Taste/smell-is normal   Tender in maxillary sinuses   No fever or aches or sweats   Did home test -was neg  Had allergy appt- has to have another covid test-she had a test today and pending     otc Zinc losenges  Robitussin CF   Immunized for covid 3/21 pfizer   Patient Active Problem List   Diagnosis Date Noted  . Elevated ALT measurement 10/10/2019  . Acute sinusitis 11/03/2017  . Obesity (BMI 30-39.9) 03/13/2017  . Seasonal allergies 03/21/2014  . Asthma, mild 03/21/2014  . Routine general medical examination at a health care facility 03/21/2014  . Rosacea 03/21/2014   . 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 Allergy   . Asthma, mild   . High plasma total homocysteine   . History of foot fracture   . Rosacea    Past Surgical History:  Procedure Laterality Date  . APPENDECTOMY    . CESAREAN SECTION  2007   Social History   Tobacco Use  . Smoking status: Never Smoker  . Smokeless tobacco: Never Used  Substance Use Topics  . Alcohol use: No    Alcohol/week: 0.0 standard drinks  . Drug use: No   Family History  Problem Relation Age of Onset  . Cancer Mother   . Stroke Paternal Uncle   . Arthritis Maternal Grandmother   . Stroke Paternal Grandmother   . Heart disease Paternal Grandfather    No Known Allergies Current Outpatient Medications on File Prior to Visit  Medication Sig Dispense Refill  . albuterol (PROVENTIL HFA;VENTOLIN HFA) 108 (90 Base) MCG/ACT inhaler Inhale 2 puffs into the lungs every 6 (six) hours as needed for wheezing or shortness of breath. 1 Inhaler 5  . Ascorbic Acid (VITAMIN C PO) Take 1 capsule by mouth daily.    . Calcium Carbonate-Vitamin D (CALCIUM + D PO) Take 1 capsule by mouth daily.    . fexofenadine (ALLEGRA) 180 MG tablet Take 180 mg by mouth daily.    Marland Kitchen FIBER PO Take 4 g by mouth daily.     Marland Kitchen FOLIC  ACID PO Take 1 tablet by mouth daily.    Marland Kitchen METRONIDAZOLE, TOPICAL, 0.75 % LOTN Apply to affected areas of face once daily at bedtime 59 mL 3  . naproxen sodium (ALEVE) 220 MG tablet Take 2 tablets by mouth daily as needed.    Marland Kitchen PORTIA-28 0.15-30 MG-MCG tablet     . Prenatal Vit-Fe Fumarate-FA (MULTIVITAMIN-PRENATAL) 27-0.8 MG TABS tablet Take 1 tablet by mouth daily at 12 noon.     No current facility-administered medications on file prior to visit.   Review of Systems  Constitutional: Negative for chills, fever and malaise/fatigue.  HENT: Positive for congestion and sinus pain. Negative for ear pain and sore throat.   Eyes: Negative for blurred vision, discharge  and redness.  Respiratory: Positive for cough. Negative for shortness of breath and stridor.   Cardiovascular: Negative for chest pain, palpitations and leg swelling.  Gastrointestinal: Negative for abdominal pain, diarrhea, nausea and vomiting.  Musculoskeletal: Negative for myalgias.  Skin: Negative for rash.  Neurological: Positive for headaches. Negative for dizziness.     Patient appears well, in no distress Weight is baseline  No facial swelling or asymmetry Normal voice-not hoarse and no slurred speech No obvious tremor or mobility impairment Moving neck and UEs normally Able to hear the call well  No wheeze or shortness of breath during interview  occ dry cough Pt has tenderness with palp of her frontal sinuses  Talkative and mentally sharp with no cognitive changes No skin changes on face or neck , no rash or pallor Affect is normal      Assessment & Plan:   Problem List Items Addressed This Visit      Respiratory   Acute sinusitis    S/p uri for over a week  Green nasal mucous/ sinus pain  Neg covid test at home-had pcr today and pending result (will call) Adv fluids and rest  Saline nasal Tessalon prn (she has some) Expectorant/robitussin If wheeze/tight-will use albuterol and update If suddenly worse will alert [place]  Update if not starting to improve in a week or if worsening        Relevant Medications   amoxicillin-clavulanate (AUGMENTIN) 875-125 MG tablet      Instructions: Take augmentin as directed for sinus infection  Drink fluids and rest  Isolate until covid test returns Robitussin and tessalon for cough  Nasal saline helps congestion  If wheeze or tight chest-use albuterol and touch base if not helpful   Let us know when your covid test returns  Update if not starting to improve in a week or if worsening    I discussed the assessment and treatment plan with the patient. The patient was provided an opportunity to ask questions and all were  answered. The patient agreed with the plan and demonstrated an understanding of the instructions.   The patient was advised to call back or seek an in-person evaluation if the symptoms worsen or if the condition fails to improve as anticipated.   Roxy Manns, MD

## 2020-01-25 NOTE — Assessment & Plan Note (Signed)
S/p uri for over a week  Green nasal mucous/ sinus pain  Neg covid test at home-had pcr today and pending result (will call) Adv fluids and rest  Saline nasal Tessalon prn (she has some) Expectorant/robitussin If wheeze/tight-will use albuterol and update If suddenly worse will alert [place]  Update if not starting to improve in a week or if worsening

## 2020-01-25 NOTE — Patient Instructions (Addendum)
Take augmentin as directed for sinus infection  Drink fluids and rest  Isolate until covid test returns Robitussin and tessalon for cough  Nasal saline helps congestion  If wheeze or tight chest-use albuterol and touch base if not helpful   Let us know when your covid test returns  Update if not starting to improve in a week or if worsening

## 2020-02-04 ENCOUNTER — Encounter: Payer: Self-pay | Admitting: Family Medicine

## 2020-08-25 ENCOUNTER — Telehealth: Payer: Self-pay

## 2020-08-25 ENCOUNTER — Other Ambulatory Visit (INDEPENDENT_AMBULATORY_CARE_PROVIDER_SITE_OTHER): Payer: BC Managed Care – PPO

## 2020-08-25 ENCOUNTER — Encounter: Payer: Self-pay | Admitting: Family Medicine

## 2020-08-25 ENCOUNTER — Other Ambulatory Visit: Payer: Self-pay | Admitting: Family Medicine

## 2020-08-25 ENCOUNTER — Other Ambulatory Visit: Payer: Self-pay

## 2020-08-25 ENCOUNTER — Telehealth (INDEPENDENT_AMBULATORY_CARE_PROVIDER_SITE_OTHER): Payer: BC Managed Care – PPO | Admitting: Family Medicine

## 2020-08-25 DIAGNOSIS — U071 COVID-19: Secondary | ICD-10-CM

## 2020-08-25 LAB — BASIC METABOLIC PANEL
BUN: 8 mg/dL (ref 6–23)
CO2: 24 mEq/L (ref 19–32)
Calcium: 8.5 mg/dL (ref 8.4–10.5)
Chloride: 107 mEq/L (ref 96–112)
Creatinine, Ser: 0.76 mg/dL (ref 0.40–1.20)
GFR: 95.63 mL/min (ref >60.00)
Glucose, Bld: 93 mg/dL (ref 70–99)
Potassium: 4 mEq/L (ref 3.5–5.1)
Sodium: 139 mEq/L (ref 135–145)

## 2020-08-25 MED ORDER — NIRMATRELVIR/RITONAVIR (PAXLOVID)TABLET: 3.0000 | ORAL_TABLET | Freq: Two times a day (BID) | ORAL | 0 refills | Status: AC

## 2020-08-25 NOTE — Telephone Encounter (Signed)
Patient was exposed to covid about 2 days ago and ended up testing positive this morning. Patient started showing sx yesterday and are progressing. Patient has had cough, congestion, hoarseness, sore throat and fatigue. Patient has asthma and is worried and wants to know tx options. There are no open appts today with any provider. Please Advise.

## 2020-08-25 NOTE — Progress Notes (Signed)
Virtual Visit via Video Note  I connected with Erica Rollins on 08/25/20 at 12:30 PM EDT by a video enabled telemedicine application and verified that I am speaking with the correct person using two identifiers.  Location: Patient: home Provider: office   I discussed the limitations of evaluation and management by telemedicine and the availability of in person appointments. The patient expressed understanding and agreed to proceed.  Parties involved in encounter  Patient: Erica Rollins  Provider:  Roxy Manns MD   History of Present Illness: Pt presents with covid 19  Positive covid test   Roommate has it  Slight ST yesterday  As day wore on felt really tired  Woke up with stuffy/runny nose and home test pos in 4 min   Cough- started  At times prod or dry  Phlegm - green  No wheeze -has inhaler but has not needed   Pushes the liquids  Body aches- legs and shoulders esp  98.1 temp-no fever but she feels chills   No n/v/d   No otc meds yet but zinc and vit D  Imm profile  Pfizer -2 and booster  H/o asthma  Pulse ox is 100% on RA today  Not bothering her very much   Lab Results  Component Value Date   CREATININE 0.89 09/24/2019   BUN 12 09/24/2019   NA 139 09/24/2019   K 4.4 09/24/2019   CL 106 09/24/2019   CO2 24 09/24/2019   Patient Active Problem List   Diagnosis Date Noted   COVID-19 08/25/2020   Elevated ALT measurement 10/10/2019   Obesity (BMI 30-39.9) 03/13/2017   Seasonal allergies 03/21/2014   Asthma, mild 03/21/2014   Routine general medical examination at a health care facility 03/21/2014   Rosacea 03/21/2014   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/21/2014   Past Medical History:  Diagnosis Date   Allergy    Asthma, mild    High plasma total homocysteine    History of foot fracture    Rosacea    Past Surgical History:  Procedure Laterality Date   APPENDECTOMY     CESAREAN SECTION  2007   Social History   Tobacco Use   Smoking status:  Never   Smokeless tobacco: Never  Substance Use Topics   Alcohol use: No    Alcohol/week: 0.0 standard drinks   Drug use: No   Family History  Problem Relation Age of Onset   Cancer Mother    Stroke Paternal Uncle    Arthritis Maternal Grandmother    Stroke Paternal Grandmother    Heart disease Paternal Grandfather    No Known Allergies Current Outpatient Medications on File Prior to Visit  Medication Sig Dispense Refill   Ascorbic Acid (VITAMIN C PO) Take 1 capsule by mouth daily.     Calcium Carbonate-Vitamin D (CALCIUM + D PO) Take 1 capsule by mouth daily.     fexofenadine (ALLEGRA) 180 MG tablet Take 180 mg by mouth daily.     fluticasone (FLONASE) 50 MCG/ACT nasal spray Place 1 spray into both nostrils daily as needed for allergies or rhinitis.     FOLIC ACID PO Take 1 tablet by mouth daily.     METRONIDAZOLE, TOPICAL, 0.75 % LOTN Apply to affected areas of face once daily at bedtime 59 mL 3   naproxen sodium (ALEVE) 220 MG tablet Take 2 tablets by mouth daily as needed.     PORTIA-28 0.15-30 MG-MCG tablet      Prenatal Vit-Fe Fumarate-FA (MULTIVITAMIN-PRENATAL) 27-0.8 MG TABS  tablet Take 1 tablet by mouth daily at 12 noon.     albuterol (PROVENTIL HFA;VENTOLIN HFA) 108 (90 Base) MCG/ACT inhaler Inhale 2 puffs into the lungs every 6 (six) hours as needed for wheezing or shortness of breath. (Patient not taking: Reported on 08/25/2020) 1 Inhaler 5   FIBER PO Take 4 g by mouth daily.  (Patient not taking: Reported on 08/25/2020)     No current facility-administered medications on file prior to visit.   Review of Systems  Constitutional:  Negative for chills and fever.  HENT:  Positive for congestion and sore throat. Negative for ear pain and sinus pain.   Eyes:  Negative for blurred vision, discharge and redness.  Respiratory:  Positive for cough. Negative for sputum production, shortness of breath, wheezing and stridor.   Cardiovascular:  Negative for chest pain, palpitations  and leg swelling.  Gastrointestinal:  Negative for abdominal pain, diarrhea, nausea and vomiting.  Musculoskeletal:  Positive for myalgias.  Skin:  Negative for rash.  Neurological:  Positive for headaches. Negative for dizziness.     Observations/Objective: Patient appears well, in no distress Weight is baseline  No facial swelling or asymmetry Normal voice-not hoarse and no slurred speech No obvious tremor or mobility impairment Moving neck and UEs normally Able to hear the call well  Occ dry cough, no sob or wheeze heard with conversation  Talkative and mentally sharp with no cognitive changes No skin changes on face or neck , no rash or pallor Affect is normal    Assessment and Plan: Problem List Items Addressed This Visit       Other   COVID-19    Fairly mild symptoms in pt with asthma  Has albuterol mdi for use prn  Will watch closely for wheezing  Plan to check bmet and if nl GFR (expected) px paxlovid  Disc poss side eff like diarrhea  inst to treat symptoms and continue to isolate  ER precautions discussed        Relevant Medications   nirmatrelvir/ritonavir EUA (PAXLOVID) TABS     Follow Up Instructions: Increase fluids  Drink fluids and rest  mucinex DM is good for cough and congestion  Nasal saline for congestion as needed  Tylenol for fever or pain or headache  Please alert [place] if symptoms worsen (if severe or short of breath please go to the ER)  The office will call you to set up a lab draw for paxlovid     I discussed the assessment and treatment plan with the patient. The patient was provided an opportunity to ask questions and all were answered. The patient agreed with the plan and demonstrated an understanding of the instructions.   The patient was advised to call back or seek an in-person evaluation if the symptoms worsen or if the condition fails to improve as anticipated.     Roxy Manns, MD

## 2020-08-25 NOTE — Patient Instructions (Signed)
Increase fluids  Drink fluids and rest  mucinex DM is good for cough and congestion  Nasal saline for congestion as needed  Tylenol for fever or pain or headache  Please alert [place] if symptoms worsen (if severe or short of breath please go to the ER)

## 2020-08-25 NOTE — Telephone Encounter (Signed)
Appt scheduled today at 12:30 with PCP

## 2020-08-26 NOTE — Assessment & Plan Note (Signed)
Fairly mild symptoms in pt with asthma  Has albuterol mdi for use prn  Will watch closely for wheezing  Plan to check bmet and if nl GFR (expected) px paxlovid  Disc poss side eff like diarrhea  inst to treat symptoms and continue to isolate  ER precautions discussed

## 2020-09-07 ENCOUNTER — Other Ambulatory Visit: Payer: Self-pay | Admitting: Family Medicine

## 2020-09-07 DIAGNOSIS — Z1231 Encounter for screening mammogram for malignant neoplasm of breast: Secondary | ICD-10-CM

## 2020-10-17 ENCOUNTER — Encounter: Payer: Self-pay | Admitting: Family Medicine

## 2020-10-17 ENCOUNTER — Other Ambulatory Visit (HOSPITAL_COMMUNITY)
Admission: RE | Admit: 2020-10-17 | Discharge: 2020-10-17 | Disposition: A | Discharge status: Home / Self Care | Payer: BC Managed Care – PPO | Source: Ambulatory Visit | Attending: Family Medicine | Admitting: Family Medicine

## 2020-10-17 ENCOUNTER — Other Ambulatory Visit: Payer: Self-pay

## 2020-10-17 ENCOUNTER — Ambulatory Visit (INDEPENDENT_AMBULATORY_CARE_PROVIDER_SITE_OTHER): Payer: BC Managed Care – PPO | Admitting: Family Medicine

## 2020-10-17 VITALS — BP 136/85 | HR 70 | Temp 97.9°F | Ht 63.5 in | Wt 206.0 lb

## 2020-10-17 DIAGNOSIS — Z0000 Encounter for general adult medical examination without abnormal findings: Secondary | ICD-10-CM | POA: Diagnosis not present

## 2020-10-17 DIAGNOSIS — Z01419 Encounter for gynecological examination (general) (routine) without abnormal findings: Secondary | ICD-10-CM | POA: Insufficient documentation

## 2020-10-17 DIAGNOSIS — L719 Rosacea, unspecified: Secondary | ICD-10-CM | POA: Diagnosis not present

## 2020-10-17 DIAGNOSIS — E669 Obesity, unspecified: Secondary | ICD-10-CM | POA: Diagnosis not present

## 2020-10-17 DIAGNOSIS — E7211 Homocystinuria: Secondary | ICD-10-CM

## 2020-10-17 DIAGNOSIS — Z23 Encounter for immunization: Secondary | ICD-10-CM

## 2020-10-17 LAB — COMPREHENSIVE METABOLIC PANEL
ALT: 12 U/L (ref 0–35)
AST: 14 U/L (ref 0–37)
Albumin: 3.9 g/dL (ref 3.5–5.2)
Alkaline Phosphatase: 47 U/L (ref 39–117)
BUN: 11 mg/dL (ref 6–23)
CO2: 25 mEq/L (ref 19–32)
Calcium: 9.6 mg/dL (ref 8.4–10.5)
Chloride: 106 mEq/L (ref 96–112)
Creatinine, Ser: 0.83 mg/dL (ref 0.40–1.20)
GFR: 85.95 mL/min (ref >60.00)
Glucose, Bld: 82 mg/dL (ref 70–99)
Potassium: 4.2 mEq/L (ref 3.5–5.1)
Sodium: 138 mEq/L (ref 135–145)
Total Bilirubin: 0.6 mg/dL (ref 0.2–1.2)
Total Protein: 6.6 g/dL (ref 6.0–8.3)

## 2020-10-17 LAB — CBC WITH DIFFERENTIAL/PLATELET
Basophils Absolute: 0 10*3/uL (ref 0.0–0.1)
Basophils Relative: 0.6 % (ref 0.0–3.0)
Eosinophils Absolute: 0.2 10*3/uL (ref 0.0–0.7)
Eosinophils Relative: 2.4 % (ref 0.0–5.0)
HCT: 41.4 % (ref 36.0–46.0)
Hemoglobin: 13.9 g/dL (ref 12.0–15.0)
Lymphocytes Relative: 27.8 % (ref 12.0–46.0)
Lymphs Abs: 1.9 10*3/uL (ref 0.7–4.0)
MCHC: 33.5 g/dL (ref 30.0–36.0)
MCV: 89.1 fl (ref 78.0–100.0)
Monocytes Absolute: 0.3 10*3/uL (ref 0.1–1.0)
Monocytes Relative: 4.1 % (ref 3.0–12.0)
Neutro Abs: 4.5 10*3/uL (ref 1.4–7.7)
Neutrophils Relative %: 65.1 % (ref 43.0–77.0)
Platelets: 229 10*3/uL (ref 150.0–400.0)
RBC: 4.65 Mil/uL (ref 3.87–5.11)
RDW: 13.4 % (ref 11.5–15.5)
WBC: 7 10*3/uL (ref 4.0–10.5)

## 2020-10-17 LAB — LIPID PANEL
Cholesterol: 190 mg/dL (ref 0–200)
HDL: 52.6 mg/dL (ref >39.00)
LDL Cholesterol: 121 mg/dL — ABNORMAL HIGH (ref 0–99)
NonHDL: 137.44
Total CHOL/HDL Ratio: 4
Triglycerides: 80 mg/dL (ref 0.0–149.0)
VLDL: 16 mg/dL (ref 0.0–40.0)

## 2020-10-17 LAB — TSH: TSH: 1.25 u[IU]/mL (ref 0.35–5.50)

## 2020-10-17 MED ORDER — LEVONORGESTREL-ETHINYL ESTRAD 0.15-30 MG-MCG PO TABS: 1.0000 | ORAL_TABLET | Freq: Every day | ORAL | 5 refills | Status: DC

## 2020-10-17 MED ORDER — LEVONORGESTREL-ETHINYL ESTRAD 0.15-30 MG-MCG PO TABS: 1.0000 | ORAL_TABLET | Freq: Every day | ORAL | 11 refills | Status: DC

## 2020-10-17 NOTE — Assessment & Plan Note (Signed)
Continues folic acid  

## 2020-10-17 NOTE — Assessment & Plan Note (Signed)
Discussed how this problem influences overall health and the risks it imposes  Reviewed plan for weight loss with lower calorie diet (via better food choices and also portion control or program like weight watchers) and exercise building up to or more than 30 minutes 5 days per week including some aerobic activity   Pt plans to get back to a modified opta via program with less sugar Walking recommended

## 2020-10-17 NOTE — Assessment & Plan Note (Signed)
On OC continuous with occ w/d bleed Doing well  Watching bp - if it goes up may need to hold OC Helps with menses Exam and pap today

## 2020-10-17 NOTE — Assessment & Plan Note (Signed)
Reviewed health habits including diet and exercise and skin cancer prevention Reviewed appropriate screening tests for age  Also reviewed health mt list, fam hx and immunization status , as well as social and family history   See HPI Labs reviewed  Plan made to cut sugar in diet  Gyn /pap done today  Flu shot today  Labs today  covid immunized/ had covid in July  Mammogram is planned later this month

## 2020-10-17 NOTE — Patient Instructions (Addendum)
Make sure to get your mammogram   Get back on track with diet and exercise for weight loss and blood pressure  Eat less processed foods Try to get most of your carbohydrates from produce (with the exception of white potatoes)  Eat less bread/pasta/rice/snack foods/cereals/sweets and other items from the middle of the grocery store (processed carbs)   Flu shot today  Labs today   Pap today

## 2020-10-17 NOTE — Progress Notes (Signed)
Subjective:    Patient ID: Erica Rollins, female    DOB: 01/19/1976, 45 y.o.   MRN: [id number]  This visit occurred during the SARS-CoV-2 public health emergency.  Safety protocols were in place, including screening questions prior to the visit, additional usage of staff PPE, and extensive cleaning of exam room while observing appropriate contact time as indicated for disinfecting solutions.   HPI Here for health maintenance exam and to review chronic medical problems    Wt Readings from Last 3 Encounters:  10/17/20 206 lb (93.4 kg)  10/08/19 190 lb 8 oz (86.4 kg)  01/19/18 217 lb 8 oz (98.7 kg)   35.92 kg/m  Gained weight back  Stopped opta via , then had covid  Wants to use a different coach or put it together herself (was expensive)  Some slim fast in am   Pap 10/14- needs a pap she thinks  One abn pap years ago   Menses - controlled with OC  No problems  Off OC -bad menses and mood    OC   0.15-30 portia -continuously  (on it for almost 47 y)  Does w/d bleed every 3-6 months  LMP before school started   Mammogram 8/21 - has one scheduled in [place] in a month  Self breast exam -not really checking/does not think so   Covid immunized Had covid in July (bad after taste with paxlovid)  Flu shot -wants to get today  Tdap 2/16    BP Readings from Last 3 Encounters:  10/17/20 136/85  08/25/20 (!) 144/89  10/08/19 128/64    May have had elevated bp in pregnancy in the past  Pulse Readings from Last 3 Encounters:  10/17/20 70  08/25/20 93  01/25/20 88   Wants to do labs today  Is fasting   Has HTN in family   Patient Active Problem List   Diagnosis Date Noted   Visit for routine gyn exam 10/17/2020   COVID-19 08/25/2020   Elevated ALT measurement 10/10/2019   Obesity (BMI 30-39.9) 03/13/2017   Seasonal allergies 03/21/2014   Asthma, mild 03/21/2014   Routine general medical examination at a health care facility 03/21/2014   Rosacea 03/21/2014    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/21/2014   Past Medical History:  Diagnosis Date   Allergy    Asthma, mild    High plasma total homocysteine    History of foot fracture    Rosacea    Past Surgical History:  Procedure Laterality Date   APPENDECTOMY     CESAREAN SECTION  2007   Social History   Tobacco Use   Smoking status: Never   Smokeless tobacco: Never  Substance Use Topics   Alcohol use: No    Alcohol/week: 0.0 standard drinks   Drug use: No   Family History  Problem Relation Age of Onset   Cancer Mother    Stroke Paternal Uncle    Arthritis Maternal Grandmother    Stroke Paternal Grandmother    Heart disease Paternal Grandfather    No Known Allergies Current Outpatient Medications on File Prior to Visit  Medication Sig Dispense Refill   albuterol (PROVENTIL HFA;VENTOLIN HFA) 108 (90 Base) MCG/ACT inhaler Inhale 2 puffs into the lungs every 6 (six) hours as needed for wheezing or shortness of breath. 1 Inhaler 5   Ascorbic Acid (VITAMIN C PO) Take 1 capsule by mouth daily.     Calcium Carbonate-Vitamin D (CALCIUM + D PO) Take 1 capsule by mouth daily.  fexofenadine (ALLEGRA) 180 MG tablet Take 180 mg by mouth daily.     FIBER PO Take 4 g by mouth daily.     fluticasone (FLONASE) 50 MCG/ACT nasal spray Place 1 spray into both nostrils daily as needed for allergies or rhinitis.     FOLIC ACID PO Take 1 tablet by mouth daily.     METRONIDAZOLE, TOPICAL, 0.75 % LOTN Apply to affected areas of face once daily at bedtime 59 mL 3   naproxen sodium (ALEVE) 220 MG tablet Take 2 tablets by mouth daily as needed.     Prenatal Vit-Fe Fumarate-FA (MULTIVITAMIN-PRENATAL) 27-0.8 MG TABS tablet Take 1 tablet by mouth daily at 12 noon.     No current facility-administered medications on file prior to visit.     Review of Systems  Constitutional:  Negative for activity change, appetite change, fatigue, fever and unexpected weight change.  HENT:  Negative for congestion, ear pain,  rhinorrhea, sinus pressure and sore throat.   Eyes:  Negative for pain, redness and visual disturbance.  Respiratory:  Negative for cough, shortness of breath and wheezing.   Cardiovascular:  Negative for chest pain and palpitations.  Gastrointestinal:  Negative for abdominal pain, blood in stool, constipation and diarrhea.  Endocrine: Negative for polydipsia and polyuria.  Genitourinary:  Negative for dysuria, frequency and urgency.  Musculoskeletal:  Negative for arthralgias, back pain and myalgias.  Skin:  Negative for pallor and rash.  Allergic/Immunologic: Negative for environmental allergies.  Neurological:  Negative for dizziness, syncope and headaches.  Hematological:  Negative for adenopathy. Does not bruise/bleed easily.  Psychiatric/Behavioral:  Negative for decreased concentration and dysphoric mood. The patient is not nervous/anxious.       Objective:   Physical Exam Constitutional:      General: She is not in acute distress.    Appearance: Normal appearance. She is well-developed. She is obese. She is not ill-appearing or diaphoretic.  HENT:     Head: Normocephalic and atraumatic.     Right Ear: Tympanic membrane, ear canal and external ear normal.     Left Ear: Tympanic membrane, ear canal and external ear normal.     Nose: Nose normal. No congestion.     Mouth/Throat:     Mouth: Mucous membranes are moist.     Pharynx: Oropharynx is clear. No posterior oropharyngeal erythema.  Eyes:     General: No scleral icterus.    Extraocular Movements: Extraocular movements intact.     Conjunctiva/sclera: Conjunctivae normal.     Pupils: Pupils are equal, round, and reactive to light.  Neck:     Thyroid: No thyromegaly.     Vascular: No carotid bruit or JVD.  Cardiovascular:     Rate and Rhythm: Normal rate and regular rhythm.     Pulses: Normal pulses.     Heart sounds: Normal heart sounds.    No gallop.  Pulmonary:     Effort: Pulmonary effort is normal. No respiratory  distress.     Breath sounds: Normal breath sounds. No wheezing.     Comments: Good air exch Chest:     Chest wall: No tenderness.  Abdominal:     General: Bowel sounds are normal. There is no distension or abdominal bruit.     Palpations: Abdomen is soft. There is no mass.     Tenderness: There is no abdominal tenderness.     Hernia: No hernia is present.  Genitourinary:    Comments: Breast exam: No mass, nodules, thickening, tenderness, bulging,  retraction, inflamation, nipple discharge or skin changes noted.  No axillary or clavicular LA.                   Anus appears normal w/o hemorrhoids or masses       External genitalia : nl appearance and hair distribution/no lesions       Urethral meatus : nl size, no lesions or prolapse       Urethra: no masses, tenderness or scarring      Bladder : no masses or tenderness       Vagina: nl general appearance, no discharge or  Lesions, no significant cystocele  or rectocele       Cervix: no lesions/ discharge or friability      Uterus: nl size, contour, position, and mobility (not fixed) , non tender      Adnexa : no masses, tenderness, enlargement or nodularity          Musculoskeletal:        General: No tenderness. Normal range of motion.     Cervical back: Normal range of motion and neck supple. No rigidity. No muscular tenderness.     Right lower leg: No edema.     Left lower leg: No edema.  Lymphadenopathy:     Cervical: No cervical adenopathy.  Skin:    General: Skin is warm and dry.     Coloration: Skin is not pale.     Findings: No erythema or rash.     Comments: Solar lentigines diffusely   Neurological:     Mental Status: She is alert. Mental status is at baseline.     Cranial Nerves: No cranial nerve deficit.     Motor: No abnormal muscle tone.     Coordination: Coordination normal.     Gait: Gait normal.     Deep Tendon Reflexes: Reflexes are normal and symmetric. Reflexes normal.  Psychiatric:        Mood and  Affect: Mood normal.        Cognition and Memory: Cognition and memory normal.          Assessment & Plan:   Problem List Items Addressed This Visit       Musculoskeletal and Integument   Rosacea    Pt plans to cut back on sugar intake (this usually helps)  Continues metro lotion daily        Other   Routine general medical examination at a health care facility - Primary    Reviewed health habits including diet and exercise and skin cancer prevention Reviewed appropriate screening tests for age  Also reviewed health mt list, fam hx and immunization status , as well as social and family history   See HPI Labs reviewed  Plan made to cut sugar in diet  Gyn /pap done today  Flu shot today  Labs today  covid immunized/ had covid in July  Mammogram is planned later this month       Relevant Orders   CBC with Differential/Platelet   Comprehensive metabolic panel   Lipid panel   TSH   Hyperhomocysteinemia (HCC)    Continues folic acid       Obesity (BMI 30-39.9)    Discussed how this problem influences overall health and the risks it imposes  Reviewed plan for weight loss with lower calorie diet (via better food choices and also portion control or program like weight watchers) and exercise building up to or more than 30 minutes 5 days  per week including some aerobic activity   Pt plans to get back to a modified opta via program with less sugar Walking recommended      Visit for routine gyn exam    On OC continuous with occ w/d bleed Doing well  Watching bp - if it goes up may need to hold OC Helps with menses Exam and pap today        Relevant Orders   Cytology - PAP([place])

## 2020-10-17 NOTE — Assessment & Plan Note (Signed)
Pt plans to cut back on sugar intake (this usually helps)  Continues metro lotion daily

## 2020-10-25 LAB — CYTOLOGY - PAP
Comment: NEGATIVE
Diagnosis: NEGATIVE
High risk HPV: NEGATIVE

## 2020-10-30 ENCOUNTER — Ambulatory Visit
Admission: RE | Admit: 2020-10-30 | Discharge: 2020-10-30 | Disposition: A | Discharge status: Home / Self Care | Payer: BC Managed Care – PPO | Source: Ambulatory Visit | Attending: Family Medicine | Admitting: Family Medicine

## 2020-10-30 ENCOUNTER — Other Ambulatory Visit: Payer: Self-pay

## 2020-10-30 DIAGNOSIS — Z1231 Encounter for screening mammogram for malignant neoplasm of breast: Secondary | ICD-10-CM

## 2020-11-21 ENCOUNTER — Ambulatory Visit: Payer: BC Managed Care – PPO | Admitting: Family Medicine

## 2020-11-21 ENCOUNTER — Other Ambulatory Visit: Payer: Self-pay

## 2020-11-21 VITALS — BP 128/80 | HR 125 | Temp 98.1°F | Ht 63.5 in | Wt 214.4 lb

## 2020-11-21 DIAGNOSIS — N390 Urinary tract infection, site not specified: Secondary | ICD-10-CM | POA: Insufficient documentation

## 2020-11-21 DIAGNOSIS — N3000 Acute cystitis without hematuria: Secondary | ICD-10-CM

## 2020-11-21 DIAGNOSIS — R300 Dysuria: Secondary | ICD-10-CM

## 2020-11-21 LAB — POC URINALSYSI DIPSTICK (AUTOMATED)
Bilirubin, UA: NEGATIVE
Blood, UA: NEGATIVE
Glucose, UA: NEGATIVE
Ketones, UA: NEGATIVE
Nitrite, UA: NEGATIVE
Protein, UA: NEGATIVE
Spec Grav, UA: 1.01 (ref 1.010–1.025)
Urobilinogen, UA: 0.2 E.U./dL
pH, UA: 6 (ref 5.0–8.0)

## 2020-11-21 MED ORDER — CEPHALEXIN 500 MG PO CAPS: 500.0000 mg | ORAL_CAPSULE | Freq: Two times a day (BID) | ORAL | 0 refills | Status: DC

## 2020-11-21 NOTE — Patient Instructions (Signed)
Drink lots of water  Take the keflex as directed   If symptoms suddenly worsen please let us know  We will alert you when the culture comes back

## 2020-11-21 NOTE — Assessment & Plan Note (Signed)
Wbc on ua with h/o urine odor and mild dysuria Enc to keep pushing water  Keflex 500 mg bid px  inst to call if symptoms suddenly worsen Handout given  Culture pending -will update and check in then

## 2020-11-21 NOTE — Progress Notes (Signed)
Subjective:    Patient ID: BLONNIE MASKE, female    DOB: 1975-07-24, 45 y.o.   MRN: [id number]  This visit occurred during the SARS-CoV-2 public health emergency.  Safety protocols were in place, including screening questions prior to the visit, additional usage of staff PPE, and extensive cleaning of exam room while observing appropriate contact time as indicated for disinfecting solutions.   HPI Pt presents with urinary symptoms   Wt Readings from Last 3 Encounters:  11/21/20 214 lb 6 oz (97.2 kg)  10/17/20 206 lb (93.4 kg)  10/08/19 190 lb 8 oz (86.4 kg)   37.38 kg/m  Foul smelling urine last week  Now it burns a little to urinate (but not frequent)  Feels blah No blood in urine Urine looks cloudy  Now pushing water  84 oz per day  Now frequency-every 30 minutes  More urgent also  Some bladder discomfort -she used a heating pad last night   Random loose stool last night  Lmp 9/26   No fever At times a little nauseated  R back pain at times   Started cranberry and acidopholus    Ua Results for orders placed or performed in visit on 11/21/20  POCT Urinalysis Dipstick (Automated)  Result Value Ref Range   Color, UA Light Yellow    Clarity, UA Clear    Glucose, UA Negative Negative   Bilirubin, UA Negative    Ketones, UA Negative    Spec Grav, UA 1.010 1.010 - 1.025   Blood, UA Negative    pH, UA 6.0 5.0 - 8.0   Protein, UA Negative Negative   Urobilinogen, UA 0.2 0.2 or 1.0 E.U./dL   Nitrite, UA Negative    Leukocytes, UA Small (1+) (A) Negative    Patient Active Problem List   Diagnosis Date Noted   UTI (urinary tract infection) 11/21/2020   Visit for routine gyn exam 10/17/2020   COVID-19 08/25/2020   Elevated ALT measurement 10/10/2019   Obesity (BMI 30-39.9) 03/13/2017   Seasonal allergies 03/21/2014   Asthma, mild 03/21/2014   Routine general medical examination at a health care facility 03/21/2014   Rosacea 03/21/2014    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/21/2014   Past Medical History:  Diagnosis Date   Allergy    Asthma, mild    High plasma total homocysteine    History of foot fracture    Rosacea    Past Surgical History:  Procedure Laterality Date   APPENDECTOMY     CESAREAN SECTION  2007   Social History   Tobacco Use   Smoking status: Never   Smokeless tobacco: Never  Substance Use Topics   Alcohol use: No    Alcohol/week: 0.0 standard drinks   Drug use: No   Family History  Problem Relation Age of Onset   Cancer Mother    Breast cancer Mother    Stroke Paternal Uncle    Arthritis Maternal Grandmother    Stroke Paternal Grandmother    Heart disease Paternal Grandfather    No Known Allergies Current Outpatient Medications on File Prior to Visit  Medication Sig Dispense Refill   albuterol (PROVENTIL HFA;VENTOLIN HFA) 108 (90 Base) MCG/ACT inhaler Inhale 2 puffs into the lungs every 6 (six) hours as needed for wheezing or shortness of breath. 1 Inhaler 5   Ascorbic Acid (VITAMIN C PO) Take 1 capsule by mouth daily.     Calcium Carbonate-Vitamin D (CALCIUM + D PO) Take 1 capsule by mouth daily.  fexofenadine (ALLEGRA) 180 MG tablet Take 180 mg by mouth daily.     FIBER PO Take 4 g by mouth daily.     fluticasone (FLONASE) 50 MCG/ACT nasal spray Place 1 spray into both nostrils daily as needed for allergies or rhinitis.     FOLIC ACID PO Take 1 tablet by mouth daily.     levonorgestrel-ethinyl estradiol (PORTIA-28) 0.15-30 MG-MCG tablet Take 1 tablet by mouth daily. Take continuously 56 tablet 5   METRONIDAZOLE, TOPICAL, 0.75 % LOTN Apply to affected areas of face once daily at bedtime 59 mL 3   naproxen sodium (ALEVE) 220 MG tablet Take 2 tablets by mouth daily as needed.     Prenatal Vit-Fe Fumarate-FA (MULTIVITAMIN-PRENATAL) 27-0.8 MG TABS tablet Take 1 tablet by mouth daily at 12 noon.     No current facility-administered medications on file prior to visit.     Review of Systems   Constitutional:  Positive for fatigue. Negative for activity change, appetite change, fever and unexpected weight change.  HENT:  Negative for congestion, ear pain, rhinorrhea, sinus pressure and sore throat.   Eyes:  Negative for pain, redness and visual disturbance.  Respiratory:  Negative for cough, shortness of breath and wheezing.   Cardiovascular:  Negative for chest pain and palpitations.  Gastrointestinal:  Negative for abdominal pain, blood in stool, constipation and diarrhea.  Endocrine: Negative for polydipsia and polyuria.  Genitourinary:  Positive for frequency and urgency. Negative for dysuria, hematuria and pelvic pain.  Musculoskeletal:  Negative for arthralgias, back pain and myalgias.  Skin:  Negative for pallor and rash.  Allergic/Immunologic: Negative for environmental allergies.  Neurological:  Negative for dizziness, syncope and headaches.  Hematological:  Negative for adenopathy. Does not bruise/bleed easily.  Psychiatric/Behavioral:  Negative for decreased concentration and dysphoric mood. The patient is not nervous/anxious.       Objective:   Physical Exam Constitutional:      General: She is not in acute distress.    Appearance: Normal appearance. She is well-developed. She is obese. She is not ill-appearing.  HENT:     Head: Normocephalic and atraumatic.  Eyes:     Conjunctiva/sclera: Conjunctivae normal.     Pupils: Pupils are equal, round, and reactive to light.  Cardiovascular:     Rate and Rhythm: Normal rate and regular rhythm.     Heart sounds: Normal heart sounds.  Pulmonary:     Effort: Pulmonary effort is normal.     Breath sounds: Normal breath sounds.  Abdominal:     General: Bowel sounds are normal. There is no distension.     Palpations: Abdomen is soft.     Tenderness: There is abdominal tenderness. There is no rebound.     Comments: No cva tenderness  Mild suprapubic tenderness No palpable bladder distension   Musculoskeletal:      Cervical back: Normal range of motion and neck supple.  Lymphadenopathy:     Cervical: No cervical adenopathy.  Skin:    Findings: No rash.  Neurological:     Mental Status: She is alert.  Psychiatric:        Mood and Affect: Mood normal.          Assessment & Plan:   Problem List Items Addressed This Visit       Genitourinary   UTI (urinary tract infection) - Primary    Wbc on ua with h/o urine odor and mild dysuria Enc to keep pushing water  Keflex 500 mg bid px  inst to call if symptoms suddenly worsen Handout given  Culture pending -will update and check in then        Relevant Medications   cephALEXin (KEFLEX) 500 MG capsule   Other Relevant Orders   Urine Culture   Other Visit Diagnoses     Dysuria       Relevant Orders   POCT Urinalysis Dipstick (Automated) (Completed)

## 2020-11-22 LAB — URINE CULTURE
MICRO NUMBER:: 12517553
SPECIMEN QUALITY:: ADEQUATE

## 2021-01-01 ENCOUNTER — Telehealth (INDEPENDENT_AMBULATORY_CARE_PROVIDER_SITE_OTHER): Payer: BC Managed Care – PPO | Admitting: Nurse Practitioner

## 2021-01-01 ENCOUNTER — Other Ambulatory Visit: Payer: Self-pay

## 2021-01-01 ENCOUNTER — Encounter: Payer: Self-pay | Admitting: Nurse Practitioner

## 2021-01-01 VITALS — HR 95 | Temp 98.0°F

## 2021-01-01 DIAGNOSIS — J069 Acute upper respiratory infection, unspecified: Secondary | ICD-10-CM | POA: Diagnosis not present

## 2021-01-01 DIAGNOSIS — R051 Acute cough: Secondary | ICD-10-CM | POA: Diagnosis not present

## 2021-01-01 DIAGNOSIS — H1032 Unspecified acute conjunctivitis, left eye: Secondary | ICD-10-CM | POA: Insufficient documentation

## 2021-01-01 MED ORDER — ERYTHROMYCIN 5 MG/GM OP OINT: 1.0000 "application " | TOPICAL_OINTMENT | Freq: Four times a day (QID) | OPHTHALMIC | 0 refills | Status: AC

## 2021-01-01 NOTE — Assessment & Plan Note (Signed)
Patient symptoms have improved minus the cough.  We will send in some Tessalon Perles to help with cough and sleep.  Continue to monitor signs and symptoms follow-up if not improving.

## 2021-01-01 NOTE — Progress Notes (Signed)
Patient ID: Erica Rollins, female    DOB: 12/30/1975, 45 y.o.   MRN: [id number]  Virtual visit completed through [employer], a video enabled telemedicine application. Due to national recommendations of social distancing due to COVID-19, a virtual visit is felt to be most appropriate for this patient at this time. Reviewed limitations, risks, security and privacy concerns of performing a virtual visit and the availability of in person appointments. I also reviewed that there may be a patient responsible charge related to this service. The patient agreed to proceed.   Patient location: home Provider location: [place] at [facility], office Persons participating in this virtual visit: patient, provider   If any vitals were documented, they were collected by patient at home unless specified below.    Pulse 95 Comment: vitals per patient  Temp 98 F (36.7 C)   LMP 11/07/2020   SpO2 98%    CC: Cough and Eye discharge Subjective:   HPI: Erica Rollins is a 45 y.o. female presenting on 01/01/2021 for Eye Problem (Sx of eye issues started on 12/31/20. Left eye-Green discharge, eye red, painful, swelling. Started on 12/27/20 with cough, hoarse, post nasal drip, some runny nose, had sore throat but that improved. No fever.)  Symptyoms on 12/27/2020 Tested twice Wednesday and Saturday and both negative Pfizer x 2 and 1 booster  Has taken robitussien every 4 hours. Delsym the last two nights that works better Eye started 01/01/2021 Left eye with itching discomfort and discharge. 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 History of pink eye in the past  Viral sympotms have improved. Cough is lingering... continue delsym and OTC treatments.    Relevant past medical, surgical, family and social history reviewed and updated as indicated. Interim medical history since our last visit reviewed. Allergies and medications reviewed and updated. Outpatient Medications Prior to Visit  Medication  Sig Dispense Refill   albuterol (PROVENTIL HFA;VENTOLIN HFA) 108 (90 Base) MCG/ACT inhaler Inhale 2 puffs into the lungs every 6 (six) hours as needed for wheezing or shortness of breath. 1 Inhaler 5   Ascorbic Acid (VITAMIN C PO) Take 1 capsule by mouth daily.     Calcium Carbonate-Vitamin D (CALCIUM + D PO) Take 1 capsule by mouth daily.     fexofenadine (ALLEGRA) 180 MG tablet Take 180 mg by mouth daily.     FIBER PO Take 4 g by mouth daily.     fluticasone (FLONASE) 50 MCG/ACT nasal spray Place 1 spray into both nostrils daily as needed for allergies or rhinitis.     FOLIC ACID PO Take 1 tablet by mouth daily.     levonorgestrel-ethinyl estradiol (PORTIA-28) 0.15-30 MG-MCG tablet Take 1 tablet by mouth daily. Take continuously 56 tablet 5   METRONIDAZOLE, TOPICAL, 0.75 % LOTN Apply to affected areas of face once daily at bedtime 59 mL 3   naproxen sodium (ALEVE) 220 MG tablet Take 2 tablets by mouth daily as needed.     Prenatal Vit-Fe Fumarate-FA (MULTIVITAMIN-PRENATAL) 27-0.8 MG TABS tablet Take 1 tablet by mouth daily at 12 noon.     cephALEXin (KEFLEX) 500 MG capsule Take 1 capsule (500 mg total) by mouth 2 (two) times daily. 14 capsule 0   No facility-administered medications prior to visit.     Per HPI unless specifically indicated in ROS section below Review of Systems  Constitutional:  Negative for chills and fever.  HENT:  Positive for postnasal drip and rhinorrhea. Negative for congestion, ear discharge,  ear pain and sore throat.   Eyes:  Positive for pain (gritty), discharge and itching. Negative for photophobia and visual disturbance.  Respiratory:  Positive for cough (yellow thick). Negative for shortness of breath and wheezing.   Cardiovascular:  Negative for chest pain.  Gastrointestinal:  Negative for diarrhea, nausea and vomiting.  Neurological:  Positive for headaches (resolved).  Objective:  Pulse 95 Comment: vitals per patient  Temp 98 F (36.7 C)   LMP  11/07/2020   SpO2 98%   Wt Readings from Last 3 Encounters:  11/21/20 214 lb 6 oz (97.2 kg)  10/17/20 206 lb (93.4 kg)  10/08/19 190 lb 8 oz (86.4 kg)       Physical exam: Gen: alert, NAD, not ill appearing Pulm: speaks in complete sentences without increased work of breathing Psych: normal mood, normal thought content      Results for orders placed or performed in visit on 11/21/20  Urine Culture   Specimen: Urine  Result Value Ref Range   MICRO NUMBER: [id number]    SPECIMEN QUALITY: Adequate    Sample Source NOT GIVEN    STATUS: FINAL    ISOLATE 1:      Less than 10,000 CFU/mL of single Gram negative organism isolated. No further testing will be performed. If clinically indicated, recollection using a method to minimize contamination, with prompt transfer to Urine Culture Transport Tube, is recommended.  POCT Urinalysis Dipstick (Automated)  Result Value Ref Range   Color, UA Light Yellow    Clarity, UA Clear    Glucose, UA Negative Negative   Bilirubin, UA Negative    Ketones, UA Negative    Spec Grav, UA 1.010 1.010 - 1.025   Blood, UA Negative    pH, UA 6.0 5.0 - 8.0   Protein, UA Negative Negative   Urobilinogen, UA 0.2 0.2 or 1.0 E.U./dL   Nitrite, UA Negative    Leukocytes, UA Small (1+) (A) Negative   Assessment & Plan:   Problem List Items Addressed This Visit       Respiratory   Viral upper respiratory tract infection    Patient symptoms have improved minus the cough.  We will send in some Tessalon Perles to help with cough and sleep.  Continue to monitor signs and symptoms follow-up if not improving.        Other   Acute cough    Associated with recent viral illness to discuss 4 to 6 weeks timeframe for ultimate improvement.  Patient continue to monitor we will send in [place] to help with cough.  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  We will treat with erythromycin ointment 4 times a day for 7 days.  Patient was instructed to not use contacts and use backup glasses until resolution of illness.  Continue to monitor      Relevant Medications   erythromycin ophthalmic ointment     No orders of the defined types were placed in this encounter.  No orders of the defined types were placed in this encounter.   I discussed the assessment and treatment plan with the patient. The patient was provided an opportunity to ask questions and all were answered. The patient agreed with the plan and demonstrated an understanding of the instructions. The patient was advised to call back or seek an in-person evaluation if the  symptoms worsen or if the condition fails to improve as anticipated.  Follow up plan: No follow-ups on file.  Romilda Garret, NP

## 2021-01-01 NOTE — Assessment & Plan Note (Signed)
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  We will treat with erythromycin ointment 4 times a day for 7 days.  Patient was instructed to not use contacts and use backup glasses until resolution of illness.  Continue to monitor

## 2021-01-01 NOTE — Assessment & Plan Note (Signed)
Associated with recent viral illness to discuss 4 to 6 weeks timeframe for ultimate improvement.  Patient continue to monitor we will send in Tessalon Perles to help with cough.  Monitor

## 2021-01-04 ENCOUNTER — Encounter: Payer: Self-pay | Admitting: Nurse Practitioner

## 2021-01-05 MED ORDER — AMOXICILLIN-POT CLAVULANATE 875-125 MG PO TABS: 1.0000 | ORAL_TABLET | Freq: Two times a day (BID) | ORAL | 0 refills | Status: AC

## 2021-01-05 NOTE — Addendum Note (Signed)
Addended by: Eden Emms on: 01/05/2021 07:47 AM   Modules accepted: Orders

## 2021-10-05 ENCOUNTER — Other Ambulatory Visit: Payer: Self-pay | Admitting: Family Medicine

## 2021-10-05 DIAGNOSIS — Z1231 Encounter for screening mammogram for malignant neoplasm of breast: Secondary | ICD-10-CM

## 2021-10-18 ENCOUNTER — Other Ambulatory Visit (INDEPENDENT_AMBULATORY_CARE_PROVIDER_SITE_OTHER): Payer: BC Managed Care – PPO

## 2021-10-18 ENCOUNTER — Telehealth (INDEPENDENT_AMBULATORY_CARE_PROVIDER_SITE_OTHER): Payer: BC Managed Care – PPO | Admitting: Family Medicine

## 2021-10-18 DIAGNOSIS — Z0000 Encounter for general adult medical examination without abnormal findings: Secondary | ICD-10-CM

## 2021-10-18 LAB — CBC WITH DIFFERENTIAL/PLATELET
Basophils Absolute: 0 10*3/uL (ref 0.0–0.1)
Basophils Relative: 0.6 % (ref 0.0–3.0)
Eosinophils Absolute: 0.1 10*3/uL (ref 0.0–0.7)
Eosinophils Relative: 1.9 % (ref 0.0–5.0)
HCT: 41.3 % (ref 36.0–46.0)
Hemoglobin: 13.8 g/dL (ref 12.0–15.0)
Lymphocytes Relative: 24.9 % (ref 12.0–46.0)
Lymphs Abs: 1.8 10*3/uL (ref 0.7–4.0)
MCHC: 33.4 g/dL (ref 30.0–36.0)
MCV: 90.1 fl (ref 78.0–100.0)
Monocytes Absolute: 0.4 10*3/uL (ref 0.1–1.0)
Monocytes Relative: 5.8 % (ref 3.0–12.0)
Neutro Abs: 4.8 10*3/uL (ref 1.4–7.7)
Neutrophils Relative %: 66.8 % (ref 43.0–77.0)
Platelets: 230 10*3/uL (ref 150.0–400.0)
RBC: 4.59 Mil/uL (ref 3.87–5.11)
RDW: 13.3 % (ref 11.5–15.5)
WBC: 7.2 10*3/uL (ref 4.0–10.5)

## 2021-10-18 LAB — TSH: TSH: 1.4 u[IU]/mL (ref 0.35–5.50)

## 2021-10-18 LAB — LIPID PANEL
Cholesterol: 183 mg/dL (ref 0–200)
HDL: 49.2 mg/dL (ref >39.00)
LDL Cholesterol: 117 mg/dL — ABNORMAL HIGH (ref 0–99)
NonHDL: 134.08
Total CHOL/HDL Ratio: 4
Triglycerides: 87 mg/dL (ref 0.0–149.0)
VLDL: 17.4 mg/dL (ref 0.0–40.0)

## 2021-10-18 LAB — COMPREHENSIVE METABOLIC PANEL
ALT: 13 U/L (ref 0–35)
AST: 14 U/L (ref 0–37)
Albumin: 3.8 g/dL (ref 3.5–5.2)
Alkaline Phosphatase: 55 U/L (ref 39–117)
BUN: 11 mg/dL (ref 6–23)
CO2: 26 mEq/L (ref 19–32)
Calcium: 9.1 mg/dL (ref 8.4–10.5)
Chloride: 105 mEq/L (ref 96–112)
Creatinine, Ser: 0.85 mg/dL (ref 0.40–1.20)
GFR: 82.94 mL/min (ref >60.00)
Glucose, Bld: 83 mg/dL (ref 70–99)
Potassium: 4.4 mEq/L (ref 3.5–5.1)
Sodium: 139 mEq/L (ref 135–145)
Total Bilirubin: 0.6 mg/dL (ref 0.2–1.2)
Total Protein: 6.5 g/dL (ref 6.0–8.3)

## 2021-10-18 NOTE — Telephone Encounter (Signed)
Lab orders

## 2021-10-25 ENCOUNTER — Encounter: Payer: Self-pay | Admitting: Family Medicine

## 2021-10-25 ENCOUNTER — Ambulatory Visit (INDEPENDENT_AMBULATORY_CARE_PROVIDER_SITE_OTHER): Payer: BC Managed Care – PPO | Admitting: Family Medicine

## 2021-10-25 VITALS — BP 118/80 | HR 78 | Temp 97.9°F | Ht 63.0 in | Wt 212.2 lb

## 2021-10-25 DIAGNOSIS — E785 Hyperlipidemia, unspecified: Secondary | ICD-10-CM

## 2021-10-25 DIAGNOSIS — Z0000 Encounter for general adult medical examination without abnormal findings: Secondary | ICD-10-CM | POA: Diagnosis not present

## 2021-10-25 DIAGNOSIS — Z1211 Encounter for screening for malignant neoplasm of colon: Secondary | ICD-10-CM | POA: Diagnosis not present

## 2021-10-25 DIAGNOSIS — E669 Obesity, unspecified: Secondary | ICD-10-CM | POA: Diagnosis not present

## 2021-10-25 DIAGNOSIS — Z23 Encounter for immunization: Secondary | ICD-10-CM | POA: Diagnosis not present

## 2021-10-25 DIAGNOSIS — E7211 Homocystinuria: Secondary | ICD-10-CM

## 2021-10-25 MED ORDER — LEVONORGESTREL-ETHINYL ESTRAD 0.15-30 MG-MCG PO TABS: 1.0000 | ORAL_TABLET | Freq: Every day | ORAL | 5 refills | Status: DC

## 2021-10-25 NOTE — Assessment & Plan Note (Signed)
Discussed how this problem influences overall health and the risks it imposes  Reviewed plan for weight loss with lower calorie diet (via better food choices and also portion control or program like weight watchers) and exercise building up to or more than 30 minutes 5 days per week including some aerobic activity    

## 2021-10-25 NOTE — Assessment & Plan Note (Signed)
Continues folic acid

## 2021-10-25 NOTE — Patient Instructions (Addendum)
Flu shot today   Get your covid shot when you are ready   If you don't hear from the cologuard people in 1-2 weeks let us know    Try a probiotic for urgent stools   For cholesterol Avoid red meat/ fried foods/ egg yolks/ fatty breakfast meats/ butter, cheese and high fat dairy/ and shellfish   You may be able to get LDL down with diet and HDL up with exercise   Stop the vitamin C for now

## 2021-10-25 NOTE — Assessment & Plan Note (Signed)
Reviewed health habits including diet and exercise and skin cancer prevention Reviewed appropriate screening tests for age  Also reviewed health mt list, fam hx and immunization status , as well as social and family history   See HPI Labs reviewed  Flu shot given Plans covid imm in pharmacy cologuard ordered  Pap utd 10/2020 Mammogram utd and planned for later this month

## 2021-10-25 NOTE — Assessment & Plan Note (Signed)
Disc goals for lipids and reasons to control them Rev last labs with pt Rev low sat fat diet in detail LDL of 117, improved Disc goals  Will continue to follow

## 2021-10-25 NOTE — Assessment & Plan Note (Signed)
She is not ready for colonoscopy yet cologuard kit ordered

## 2021-10-25 NOTE — Progress Notes (Signed)
Subjective:    Patient ID: Erica Rollins, female    DOB: 1975/06/16, 46 y.o.   MRN: [id number]  HPI Here for health maintenance exam and to review chronic medical problems    Wt Readings from Last 3 Encounters:  10/25/21 212 lb 4 oz (96.3 kg)  11/21/20 214 lb 6 oz (97.2 kg)  10/17/20 206 lb (93.4 kg)   37.60 kg/m  A lot going on Daughter had surgery  She has been pulling catheter out  Power outage at home and a bathroom leak  Very stressful  Creating a new position at work- chaotic but should be good later   Tries to self care- it is very hard with her schedule  Some emotional eating  Not walking as much -but last weekend a neighbor offered to go with her  [place]- 3 times per week (class and self teach)    Immunization History  Administered Date(s) Administered   Influenza,inj,Quad PF,6+ Mos 09/30/2018, 10/17/2020   Influenza-Unspecified 10/19/2013, 11/04/2015, 11/03/2016   PFIZER(Purple Top)SARS-COV-2 Vaccination 03/19/2019, 04/13/2019   Pfizer Covid-19 Vaccine Bivalent Booster 56yr & up 11/07/2020   Pneumococcal Polysaccharide-23 03/12/2017   Tdap 03/21/2014   Health Maintenance Due  Topic Date Due   Hepatitis C Screening  Never done   INFLUENZA VACCINE  09/04/2021   COLONOSCOPY (Pts 45-420yrInsurance coverage will need to be confirmed)  Never done    Flu shot : today   Colon cancer screening : not ready to do colonoscopy yet Wants to do the cologuard   Had food poisoning in May Still occ urgent stools   Mammogram 10/2020- has it planned later this month Mother had breast cancer  Self breast exam : no lumps   Pap 10/2020 nl with neg HPV  Takes OC Has some break through bleeding occ  Not that bothersome  No new partners   BP Readings from Last 3 Encounters:  10/25/21 118/80  11/21/20 128/80  10/17/20 136/85   Pulse Readings from Last 3 Encounters:  10/25/21 78  01/01/21 95  11/21/20 (!) 125     Elevated homocysteine in the past Takes  folic acid / prenatal vitamin   Elevated ALT in the past Lab Results  Component Value Date   ALT 13 10/18/2021   AST 14 10/18/2021   ALKPHOS 55 10/18/2021   BILITOT 0.6 10/18/2021   Cholesterol Lab Results  Component Value Date   CHOL 183 10/18/2021   CHOL 190 10/17/2020   CHOL 178 09/24/2019   Lab Results  Component Value Date   HDL 49.20 10/18/2021   HDL 52.60 10/17/2020   HDL 33.60 (L) 09/24/2019   Lab Results  Component Value Date   LDLCALC 117 (H) 10/18/2021   LDLCALC 121 (H) 10/17/2020   LDLCALC 124 (H) 09/24/2019   Lab Results  Component Value Date   TRIG 87.0 10/18/2021   TRIG 80.0 10/17/2020   TRIG 102.0 09/24/2019   Lab Results  Component Value Date   CHOLHDL 4 10/18/2021   CHOLHDL 4 10/17/2020   CHOLHDL 5 09/24/2019   No results found for: "LDLDIRECT"  Mother has high cholesterol  Diet is fair   Some fast food   Other labs Lab Results  Component Value Date   CREATININE 0.85 10/18/2021   BUN 11 10/18/2021   NA 139 10/18/2021   K 4.4 10/18/2021   CL 105 10/18/2021   CO2 26 10/18/2021   Lab Results  Component Value Date   WBC 7.2 10/18/2021  HGB 13.8 10/18/2021   HCT 41.3 10/18/2021   MCV 90.1 10/18/2021   PLT 230.0 10/18/2021   Lab Results  Component Value Date   TSH 1.40 10/18/2021   Glucose 83  Patient Active Problem List   Diagnosis Date Noted   Colon cancer screening 10/25/2021   Mild hyperlipidemia 10/25/2021   Visit for routine gyn exam 10/17/2020   Obesity (BMI 30-39.9) 03/13/2017   Seasonal allergies 03/21/2014   Asthma, mild 03/21/2014   Routine general medical examination at a health care facility 03/21/2014   Rosacea 03/21/2014   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   Past Medical History:  Diagnosis Date   Allergy    Asthma, mild    High plasma total homocysteine    History of foot fracture    Rosacea    Past Surgical History:  Procedure Laterality Date   APPENDECTOMY     CESAREAN SECTION  2007    Social History   Tobacco Use   Smoking status: Never   Smokeless tobacco: Never  Substance Use Topics   Alcohol use: No    Alcohol/week: 0.0 standard drinks of alcohol   Drug use: No   Family History  Problem Relation Age of Onset   Cancer Mother    Breast cancer Mother    Stroke Paternal Uncle    Arthritis Maternal Grandmother    Stroke Paternal Grandmother    Heart disease Paternal Grandfather    No Known Allergies Current Outpatient Medications on File Prior to Visit  Medication Sig Dispense Refill   albuterol (PROVENTIL HFA;VENTOLIN HFA) 108 (90 Base) MCG/ACT inhaler Inhale 2 puffs into the lungs every 6 (six) hours as needed for wheezing or shortness of breath. 1 Inhaler 5   Ascorbic Acid (VITAMIN C PO) Take 1 capsule by mouth daily.     Calcium Carbonate-Vitamin D (CALCIUM + D PO) Take 1 capsule by mouth daily.     fexofenadine (ALLEGRA) 180 MG tablet Take 180 mg by mouth daily.     FIBER PO Take 4 g by mouth daily.     fluticasone (FLONASE) 50 MCG/ACT nasal spray Place 1 spray into both nostrils daily as needed for allergies or rhinitis.     FOLIC ACID PO Take 1 tablet by mouth daily.     METRONIDAZOLE, TOPICAL, 0.75 % LOTN Apply to affected areas of face once daily at bedtime 59 mL 3   naproxen sodium (ALEVE) 220 MG tablet Take 2 tablets by mouth daily as needed.     Prenatal Vit-Fe Fumarate-FA (MULTIVITAMIN-PRENATAL) 27-0.8 MG TABS tablet Take 1 tablet by mouth daily at 12 noon.     No current facility-administered medications on file prior to visit.     Review of Systems  Constitutional:  Negative for activity change, appetite change, fatigue, fever and unexpected weight change.  HENT:  Negative for congestion, ear pain, rhinorrhea, sinus pressure and sore throat.   Eyes:  Negative for pain, redness and visual disturbance.  Respiratory:  Negative for cough, shortness of breath and wheezing.   Cardiovascular:  Negative for chest pain and palpitations.   Gastrointestinal:  Negative for abdominal pain, blood in stool, constipation and diarrhea.  Endocrine: Negative for polydipsia and polyuria.  Genitourinary:  Negative for dysuria, frequency and urgency.  Musculoskeletal:  Negative for arthralgias, back pain and myalgias.  Skin:  Negative for pallor and rash.  Allergic/Immunologic: Negative for environmental allergies.  Neurological:  Negative for dizziness, syncope and headaches.  Hematological:  Negative for adenopathy. Does not bruise/bleed easily.  Psychiatric/Behavioral:  Negative for decreased concentration and dysphoric mood. The patient is not nervous/anxious.        Many stressors  Occ anxiety       Objective:   Physical Exam Constitutional:      General: She is not in acute distress.    Appearance: Normal appearance. She is well-developed. She is obese. She is not ill-appearing or diaphoretic.  HENT:     Head: Normocephalic and atraumatic.     Right Ear: Tympanic membrane, ear canal and external ear normal.     Left Ear: Tympanic membrane, ear canal and external ear normal.     Nose: Nose normal. No congestion.     Mouth/Throat:     Mouth: Mucous membranes are moist.     Pharynx: Oropharynx is clear. No posterior oropharyngeal erythema.  Eyes:     General: No scleral icterus.    Extraocular Movements: Extraocular movements intact.     Conjunctiva/sclera: Conjunctivae normal.     Pupils: Pupils are equal, round, and reactive to light.  Neck:     Thyroid: No thyromegaly.     Vascular: No carotid bruit or JVD.  Cardiovascular:     Rate and Rhythm: Normal rate and regular rhythm.     Pulses: Normal pulses.     Heart sounds: Normal heart sounds.     No gallop.  Pulmonary:     Effort: Pulmonary effort is normal. No respiratory distress.     Breath sounds: Normal breath sounds. No wheezing.     Comments: Good air exch Chest:     Chest wall: No tenderness.  Abdominal:     General: Bowel sounds are normal. There is no  distension or abdominal bruit.     Palpations: Abdomen is soft. There is no mass.     Tenderness: There is no abdominal tenderness.     Hernia: No hernia is present.  Genitourinary:    Comments: Breast exam: No mass, nodules, thickening, tenderness, bulging, retraction, inflamation, nipple discharge or skin changes noted.  No axillary or clavicular LA.     Musculoskeletal:        General: No tenderness. Normal range of motion.     Cervical back: Normal range of motion and neck supple. No rigidity. No muscular tenderness.     Right lower leg: No edema.     Left lower leg: No edema.     Comments: No kyphosis   Lymphadenopathy:     Cervical: No cervical adenopathy.  Skin:    General: Skin is warm and dry.     Coloration: Skin is not pale.     Findings: No erythema or rash.  Neurological:     Mental Status: She is alert. Mental status is at baseline.     Cranial Nerves: No cranial nerve deficit.     Motor: No abnormal muscle tone.     Coordination: Coordination normal.     Gait: Gait normal.     Deep Tendon Reflexes: Reflexes are normal and symmetric. Reflexes normal.  Psychiatric:        Mood and Affect: Mood normal.        Cognition and Memory: Cognition and memory normal.     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[place])    Continues folic acid       Mild hyperlipidemia    Disc goals for lipids and reasons to control them Rev last labs with pt Rev low sat fat diet in detail LDL of 117, improved Disc goals  Will continue to follow       Obesity (BMI 30-39.9)    Discussed how this problem influences overall health and the risks it imposes  Reviewed plan for weight loss with lower calorie diet (via better food choices  and also portion control or program like weight watchers) and exercise building up to or more than 30 minutes 5 days per week including some aerobic activity         Routine general medical examination at a health care facility - Primary    Reviewed health habits including diet and exercise and skin cancer prevention Reviewed appropriate screening tests for age  Also reviewed health mt list, fam hx and immunization status , as well as social and family history   See HPI Labs reviewed  Flu shot given Plans covid imm in pharmacy cologuard ordered  Pap utd 10/2020 Mammogram utd and planned for later this month       Relevant Orders   Flu Vaccine QUAD 6+ mos PF IM (Fluarix Quad PF) (Completed)   Other Visit Diagnoses     Need for influenza vaccination       Relevant Orders   Flu Vaccine QUAD 6+ mos PF IM (Fluarix Quad PF) (Completed)

## 2021-10-31 ENCOUNTER — Ambulatory Visit
Admission: RE | Admit: 2021-10-31 | Discharge: 2021-10-31 | Disposition: A | Discharge status: Home / Self Care | Payer: BC Managed Care – PPO | Source: Ambulatory Visit | Attending: Family Medicine | Admitting: Family Medicine

## 2021-10-31 DIAGNOSIS — Z1231 Encounter for screening mammogram for malignant neoplasm of breast: Secondary | ICD-10-CM

## 2021-11-02 ENCOUNTER — Other Ambulatory Visit: Payer: Self-pay | Admitting: Family Medicine

## 2021-11-02 DIAGNOSIS — R928 Other abnormal and inconclusive findings on diagnostic imaging of breast: Secondary | ICD-10-CM

## 2021-11-06 ENCOUNTER — Other Ambulatory Visit: Payer: Self-pay | Admitting: Family Medicine

## 2021-11-06 ENCOUNTER — Ambulatory Visit
Admission: RE | Admit: 2021-11-06 | Discharge: 2021-11-06 | Disposition: A | Discharge status: Home / Self Care | Payer: BC Managed Care – PPO | Source: Ambulatory Visit | Attending: Family Medicine | Admitting: Family Medicine

## 2021-11-06 DIAGNOSIS — R928 Other abnormal and inconclusive findings on diagnostic imaging of breast: Secondary | ICD-10-CM

## 2021-11-06 DIAGNOSIS — N6489 Other specified disorders of breast: Secondary | ICD-10-CM

## 2021-11-21 ENCOUNTER — Ambulatory Visit
Admission: RE | Admit: 2021-11-21 | Discharge: 2021-11-21 | Disposition: A | Discharge status: Home / Self Care | Payer: BC Managed Care – PPO | Source: Ambulatory Visit | Attending: Family Medicine | Admitting: Family Medicine

## 2021-11-21 DIAGNOSIS — N6489 Other specified disorders of breast: Secondary | ICD-10-CM

## 2021-11-22 ENCOUNTER — Encounter: Payer: Self-pay | Admitting: Family Medicine

## 2021-11-23 ENCOUNTER — Telehealth: Payer: Self-pay | Admitting: Family Medicine

## 2021-11-23 NOTE — Telephone Encounter (Signed)
It was not a form it was just the pathology results for biopsy, results are already in epic dated 11/21/21

## 2021-11-23 NOTE — Telephone Encounter (Signed)
[facility] dropped off a lab report for pt. Form will be in pcp folder.

## 2021-12-16 ENCOUNTER — Encounter: Payer: Self-pay | Admitting: Family Medicine

## 2022-09-20 ENCOUNTER — Other Ambulatory Visit: Payer: Self-pay | Admitting: Family Medicine

## 2022-09-20 DIAGNOSIS — Z1231 Encounter for screening mammogram for malignant neoplasm of breast: Secondary | ICD-10-CM

## 2022-09-21 ENCOUNTER — Other Ambulatory Visit: Payer: Self-pay | Admitting: Family Medicine

## 2022-11-01 ENCOUNTER — Encounter: Payer: BC Managed Care – PPO | Admitting: Family Medicine

## 2022-11-04 ENCOUNTER — Ambulatory Visit
Admission: RE | Admit: 2022-11-04 | Discharge: 2022-11-04 | Disposition: A | Discharge status: Home / Self Care | Payer: BC Managed Care – PPO | Source: Ambulatory Visit | Attending: Family Medicine | Admitting: Family Medicine

## 2022-11-04 DIAGNOSIS — Z1231 Encounter for screening mammogram for malignant neoplasm of breast: Secondary | ICD-10-CM

## 2022-11-08 ENCOUNTER — Encounter: Payer: BC Managed Care – PPO | Admitting: Family Medicine

## 2022-11-26 NOTE — Progress Notes (Unsigned)
Subjective:    Patient ID: Erica Rollins, female    DOB: 03/27/1975, 47 y.o.   MRN: [id number]  HPI  Here for health maintenance exam and to review chronic medical problems   Wt Readings from Last 3 Encounters:  11/27/22 220 lb (99.8 kg)  10/25/21 212 lb 4 oz (96.3 kg)  11/21/20 214 lb 6 oz (97.2 kg)   38.97 kg/m  Vitals:   11/27/22 0945  BP: 112/84  Pulse: 79  Temp: 98.5 F (36.9 C)  SpO2: 98%    Immunization History  Administered Date(s) Administered   Influenza, Mdck, Trivalent,PF 6+ MOS(egg free) 10/05/2022   Influenza,inj,Quad PF,6+ Mos 09/30/2018, 10/17/2020, 10/25/2021   Influenza-Unspecified 10/19/2013, 11/04/2015, 11/03/2016   PFIZER Comirnaty(Gray Top)Covid-19 Tri-Sucrose Vaccine 10/29/2021   PFIZER(Purple Top)SARS-COV-2 Vaccination 03/19/2019, 04/13/2019   Pfizer Covid-19 Vaccine Bivalent Booster 33yrs & up 11/07/2020   Pfizer(Comirnaty)Fall Seasonal Vaccine 12 years and older 10/05/2022   Pneumococcal Polysaccharide-23 03/12/2017   Tdap 03/21/2014    Health Maintenance Due  Topic Date Due   Hepatitis C Screening  Never done   Colonoscopy  Never done   Hard year-special needs daughter Daughter has bladder problems, surgery, sepsis , VP/ [facility] shunt ,then mrsa (senior in [employer])  Father had a heart attack  Husband had ccy   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   Mammogram 10/2022  Mother had breast cancer in her 75s  Self breast exam - no lumps   Gyn health Pap 10/2020 normal with neg HPV Takes OC 0.15-30 Continuous  No breakthrough for a while     Colon cancer screening  Cologuard ordered a year ago-not done  Wants to do colonoscopy now   Bone health   Falls- no  Fractures-no  Supplements - ca with d  Exercise :  [facility] [employer] in [place]  When she has time goes 3 times per week/ using machines and bike     Chronic left hip pain  Did PT     Mood    11/27/2022    9:52 AM 10/25/2021   10:40 AM 10/17/2020    10:06 AM 10/08/2019   12:52 PM 03/12/2017    2:17 PM  Depression screen PHQ 2/9  Decreased Interest 1 0 0 1 0  Down, Depressed, Hopeless 1 0 0 0 0  PHQ - 2 Score 2 0 0 1 0  Altered sleeping 1 1  0   Tired, decreased energy 1 1  1    Change in appetite 1 1  0   Feeling bad or failure about yourself  1 0  0   Trouble concentrating 1 0  1   Moving slowly or fidgety/restless 1 0  1   Suicidal thoughts 0 0  0   PHQ-9 Score 8 3  4    Difficult doing work/chores Somewhat difficult      Sees counselor every other week Is helpful     Takes folic acid for elevated homocysteine level in past   Hyperlipidemia Lab Results  Component Value Date   CHOL 217 (H) 11/27/2022   HDL 45.30 11/27/2022   LDLCALC 148 (H) 11/27/2022   TRIG 117.0 11/27/2022   CHOLHDL 5 11/27/2022   Needs labs today     Patient Active Problem List   Diagnosis Date Noted   Diabetes mellitus screening 11/27/2022   Colon cancer screening 10/25/2021   Mild hyperlipidemia 10/25/2021   Visit for routine gyn exam 10/17/2020   Obesity (BMI  30-39.9) 03/13/2017   Seasonal allergies 03/21/2014   Asthma, mild 03/21/2014   Routine general medical examination at a health care facility 03/21/2014   Rosacea 03/21/2014   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/21/2014   Past Medical History:  Diagnosis Date   Allergy    Asthma, mild    High plasma total homocysteine    History of foot fracture    Rosacea    Past Surgical History:  Procedure Laterality Date   APPENDECTOMY     CESAREAN SECTION  2007   Social History   Tobacco Use   Smoking status: Never   Smokeless tobacco: Never  Substance Use Topics   Alcohol use: No    Alcohol/week: 0.0 standard drinks of alcohol   Drug use: No   Family History  Problem Relation Age of Onset   Cancer Mother    Breast cancer Mother 54 - 59   Stroke Paternal Uncle    Arthritis Maternal Grandmother    Stroke Paternal Grandmother    Heart disease Paternal Grandfather    Allergies   Allergen Reactions   Mtx Topical Pain [Lidocaine-Menthol]     Pt has fainted with this in the past, unsure if it had epinephrine    Current Outpatient Medications on File Prior to Visit  Medication Sig Dispense Refill   albuterol (PROVENTIL HFA;VENTOLIN HFA) 108 (90 Base) MCG/ACT inhaler Inhale 2 puffs into the lungs every 6 (six) hours as needed for wheezing or shortness of breath. 1 Inhaler 5   Ascorbic Acid (VITAMIN C PO) Take 1 capsule by mouth daily.     Calcium Carbonate-Vitamin D (CALCIUM + D PO) Take 1 capsule by mouth daily.     fexofenadine (ALLEGRA) 180 MG tablet Take 180 mg by mouth daily.     FIBER PO Take 4 g by mouth daily.     fluticasone (FLONASE) 50 MCG/ACT nasal spray Place 1 spray into both nostrils daily as needed for allergies or rhinitis.     FOLIC ACID PO Take 1 tablet by mouth daily.     METRONIDAZOLE, TOPICAL, 0.75 % LOTN Apply to affected areas of face once daily at bedtime 59 mL 3   naproxen sodium (ALEVE) 220 MG tablet Take 2 tablets by mouth daily as needed.     Prenatal Vit-Fe Fumarate-FA (MULTIVITAMIN-PRENATAL) 27-0.8 MG TABS tablet Take 1 tablet by mouth daily at 12 noon.     No current facility-administered medications on file prior to visit.    Review of Systems  Constitutional:  Positive for fatigue. Negative for activity change, appetite change, fever and unexpected weight change.  HENT:  Negative for congestion, ear pain, rhinorrhea, sinus pressure and sore throat.   Eyes:  Negative for pain, redness and visual disturbance.  Respiratory:  Negative for cough, shortness of breath and wheezing.   Cardiovascular:  Negative for chest pain and palpitations.  Gastrointestinal:  Negative for abdominal pain, blood in stool, constipation and diarrhea.  Endocrine: Negative for polydipsia and polyuria.  Genitourinary:  Negative for dysuria, frequency and urgency.  Musculoskeletal:  Negative for arthralgias, back pain and myalgias.  Skin:  Negative for pallor  and rash.  Allergic/Immunologic: Negative for environmental allergies.  Neurological:  Negative for dizziness, syncope and headaches.  Hematological:  Negative for adenopathy. Does not bruise/bleed easily.  Psychiatric/Behavioral:  Positive for decreased concentration and dysphoric mood. Negative for suicidal ideas. The patient is nervous/anxious.        Very high stress level        Objective:  Physical Exam Constitutional:      General: She is not in acute distress.    Appearance: Normal appearance. She is well-developed. She is obese. She is not ill-appearing or diaphoretic.  HENT:     Head: Normocephalic and atraumatic.     Right Ear: Tympanic membrane, ear canal and external ear normal.     Left Ear: Tympanic membrane, ear canal and external ear normal.     Nose: Nose normal. No congestion.     Mouth/Throat:     Mouth: Mucous membranes are moist.     Pharynx: Oropharynx is clear. No posterior oropharyngeal erythema.  Eyes:     General: No scleral icterus.    Extraocular Movements: Extraocular movements intact.     Conjunctiva/sclera: Conjunctivae normal.     Pupils: Pupils are equal, round, and reactive to light.  Neck:     Thyroid: No thyromegaly.     Vascular: No carotid bruit or JVD.  Cardiovascular:     Rate and Rhythm: Normal rate and regular rhythm.     Pulses: Normal pulses.     Heart sounds: Normal heart sounds.     No gallop.  Pulmonary:     Effort: Pulmonary effort is normal. No respiratory distress.     Breath sounds: Normal breath sounds. No wheezing.     Comments: Good air exch Chest:     Chest wall: No tenderness.  Abdominal:     General: Bowel sounds are normal. There is no distension or abdominal bruit.     Palpations: Abdomen is soft. There is no mass.     Tenderness: There is no abdominal tenderness.     Hernia: No hernia is present.  Genitourinary:    Comments: Breast exam: No mass, nodules, thickening, tenderness, bulging, retraction,  inflamation, nipple discharge or skin changes noted.  No axillary or clavicular LA.     Musculoskeletal:        General: No tenderness. Normal range of motion.     Cervical back: Normal range of motion and neck supple. No rigidity. No muscular tenderness.     Right lower leg: No edema.     Left lower leg: No edema.     Comments: No kyphosis   Lymphadenopathy:     Cervical: No cervical adenopathy.  Skin:    General: Skin is warm and dry.     Coloration: Skin is not pale.     Findings: No erythema or rash.     Comments: Solar lentigines diffusely  Acne -facial   Neurological:     Mental Status: She is alert. Mental status is at baseline.     Cranial Nerves: No cranial nerve deficit.     Motor: No abnormal muscle tone.     Coordination: Coordination normal.     Gait: Gait normal.     Deep Tendon Reflexes: Reflexes are normal and symmetric. Reflexes normal.  Psychiatric:        Mood and Affect: Mood normal.        Cognition and Memory: Cognition and memory normal.     Comments: Candidly discusses symptoms and stressors             Assessment & Plan:   Problem List Items Addressed This Visit       Other   Colon cancer screening    Ref done for screening colonoscopy       Relevant Orders   Ambulatory referral to Gastroenterology   Diabetes mellitus screening    A1c  ordered disc imp of low glycemic diet and wt loss to prevent DM2       Relevant Orders   Hemoglobin A1c (Completed)   Hyperhomocysteinemia ([facility])    Continues folic acid      Mild hyperlipidemia    Disc goals for lipids and reasons to control them Rev last labs with pt Rev low sat fat diet in detail  Labs ordered        Relevant Orders   Lipid panel (Completed)   Obesity (BMI 30-39.9)    Discussed how this problem influences overall health and the risks it imposes  Reviewed plan for weight loss with lower calorie diet (via better food choices (lower glycemic and portion control) along with  exercise building up to or more than 30 minutes 5 days per week including some aerobic activity and strength training         Routine general medical examination at a health care facility - Primary    Reviewed health habits including diet and exercise and skin cancer prevention Reviewed appropriate screening tests for age  Also reviewed health mt list, fam hx and immunization status , as well as social and family history   See HPI Labs reviewed and ordered Mammogram 10/2022  Pap 10/2020  GI referral done for screening colonoscopy  Discussed fall prevention, supplements and exercise for bone density  PHQ 8 in setting of severe stress/ continues counseling       Relevant Orders   TSH (Completed)   Lipid panel (Completed)   Comprehensive metabolic panel (Completed)   CBC with Differential/Platelet (Completed)

## 2022-11-27 ENCOUNTER — Encounter: Payer: Self-pay | Admitting: Family Medicine

## 2022-11-27 ENCOUNTER — Ambulatory Visit (INDEPENDENT_AMBULATORY_CARE_PROVIDER_SITE_OTHER): Payer: BC Managed Care – PPO | Admitting: Family Medicine

## 2022-11-27 VITALS — BP 112/84 | HR 79 | Temp 98.5°F | Ht 63.0 in | Wt 220.0 lb

## 2022-11-27 DIAGNOSIS — E669 Obesity, unspecified: Secondary | ICD-10-CM

## 2022-11-27 DIAGNOSIS — Z0000 Encounter for general adult medical examination without abnormal findings: Secondary | ICD-10-CM | POA: Diagnosis not present

## 2022-11-27 DIAGNOSIS — Z131 Encounter for screening for diabetes mellitus: Secondary | ICD-10-CM | POA: Diagnosis not present

## 2022-11-27 DIAGNOSIS — E7211 Homocystinuria: Secondary | ICD-10-CM

## 2022-11-27 DIAGNOSIS — E785 Hyperlipidemia, unspecified: Secondary | ICD-10-CM

## 2022-11-27 DIAGNOSIS — Z1211 Encounter for screening for malignant neoplasm of colon: Secondary | ICD-10-CM

## 2022-11-27 LAB — CBC WITH DIFFERENTIAL/PLATELET
Basophils Absolute: 0.1 10*3/uL (ref 0.0–0.1)
Basophils Relative: 0.8 % (ref 0.0–3.0)
Eosinophils Absolute: 0.2 10*3/uL (ref 0.0–0.7)
Eosinophils Relative: 2.3 % (ref 0.0–5.0)
HCT: 43.5 % (ref 36.0–46.0)
Hemoglobin: 14.3 g/dL (ref 12.0–15.0)
Lymphocytes Relative: 26 % (ref 12.0–46.0)
Lymphs Abs: 2.4 10*3/uL (ref 0.7–4.0)
MCHC: 32.8 g/dL (ref 30.0–36.0)
MCV: 89.8 fL (ref 78.0–100.0)
Monocytes Absolute: 0.5 10*3/uL (ref 0.1–1.0)
Monocytes Relative: 5.2 % (ref 3.0–12.0)
Neutro Abs: 6.1 10*3/uL (ref 1.4–7.7)
Neutrophils Relative %: 65.7 % (ref 43.0–77.0)
Platelets: 268 10*3/uL (ref 150.0–400.0)
RBC: 4.85 Mil/uL (ref 3.87–5.11)
RDW: 13.1 % (ref 11.5–15.5)
WBC: 9.3 10*3/uL (ref 4.0–10.5)

## 2022-11-27 LAB — COMPREHENSIVE METABOLIC PANEL
ALT: 12 U/L (ref 0–35)
AST: 14 U/L (ref 0–37)
Albumin: 4 g/dL (ref 3.5–5.2)
Alkaline Phosphatase: 61 U/L (ref 39–117)
BUN: 8 mg/dL (ref 6–23)
CO2: 24 meq/L (ref 19–32)
Calcium: 9.1 mg/dL (ref 8.4–10.5)
Chloride: 107 meq/L (ref 96–112)
Creatinine, Ser: 0.76 mg/dL (ref 0.40–1.20)
GFR: 94.13 mL/min (ref >60.00)
Glucose, Bld: 85 mg/dL (ref 70–99)
Potassium: 4.2 meq/L (ref 3.5–5.1)
Sodium: 138 meq/L (ref 135–145)
Total Bilirubin: 0.6 mg/dL (ref 0.2–1.2)
Total Protein: 6.7 g/dL (ref 6.0–8.3)

## 2022-11-27 LAB — HEMOGLOBIN A1C: Hgb A1c MFr Bld: 5.3 % (ref 4.6–6.5)

## 2022-11-27 LAB — TSH: TSH: 1.07 u[IU]/mL (ref 0.35–5.50)

## 2022-11-27 LAB — LIPID PANEL
Cholesterol: 217 mg/dL — ABNORMAL HIGH (ref 0–200)
HDL: 45.3 mg/dL (ref >39.00)
LDL Cholesterol: 148 mg/dL — ABNORMAL HIGH (ref 0–99)
NonHDL: 171.34
Total CHOL/HDL Ratio: 5
Triglycerides: 117 mg/dL (ref 0.0–149.0)
VLDL: 23.4 mg/dL (ref 0.0–40.0)

## 2022-11-27 MED ORDER — LEVONORGESTREL-ETHINYL ESTRAD 0.15-30 MG-MCG PO TABS: ORAL_TABLET | ORAL | 3 refills | Status: DC

## 2022-11-27 NOTE — Patient Instructions (Addendum)
Call to schedule a screening colonoscopy   [facility]  [phone]  Continue exercise  Add some strength training to your routine, this is important for bone and brain health and can reduce your risk of falls and help your body use insulin properly and regulate weight  Light weights, exercise bands , and internet videos are a good way to start  Yoga (chair or regular), machines , floor exercises or a gym with machines are also good options    Try to get most of your carbohydrates from produce (with the exception of white potatoes) and whole grains Eat less bread/pasta/rice/snack foods/cereals/sweets and other items from the middle of the grocery store (processed carbs)  Labs today

## 2022-11-27 NOTE — Assessment & Plan Note (Signed)
Reviewed health habits including diet and exercise and skin cancer prevention Reviewed appropriate screening tests for age  Also reviewed health mt list, fam hx and immunization status , as well as social and family history   See HPI Labs reviewed and ordered Mammogram 10/2022  Pap 10/2020  GI referral done for screening colonoscopy  Discussed fall prevention, supplements and exercise for bone density  PHQ 8 in setting of severe stress/ continues counseling

## 2022-11-27 NOTE — Assessment & Plan Note (Signed)
A1c ordered disc imp of low glycemic diet and wt loss to prevent DM2  

## 2022-11-27 NOTE — Assessment & Plan Note (Signed)
.  Disc goals for lipids and reasons to control them Rev last labs with pt Rev low sat fat diet in detail Labs ordered  

## 2022-11-27 NOTE — Assessment & Plan Note (Signed)
 Discussed how this problem influences overall health and the risks it imposes  Reviewed plan for weight loss with lower calorie diet (via better food choices (lower glycemic and portion control) along with exercise building up to or more than 30 minutes 5 days per week including some aerobic activity and strength training

## 2022-11-27 NOTE — Assessment & Plan Note (Signed)
Continues folic acid  

## 2022-11-27 NOTE — Assessment & Plan Note (Signed)
Ref done for screening colonoscopy 

## 2022-12-07 IMAGING — MG MM DIGITAL SCREENING BILAT W/ TOMO AND CAD
6 of 10 series · 6 of 30 positions shown · non-contrast
Comparison: None.

ACR Breast Density Category a: The breast tissue is almost entirely
fatty.

CLINICAL DATA: Screening.

EXAM:
DIGITAL SCREENING BILATERAL MAMMOGRAM WITH TOMOSYNTHESIS AND CAD
TECHNIQUE: Bilateral screening digital craniocaudal and mediolateral oblique
mammograms were obtained. Bilateral screening digital breast
tomosynthesis was performed. The images were evaluated with
computer-aided detection.

[R MLO synth-2D (1 of 2)]
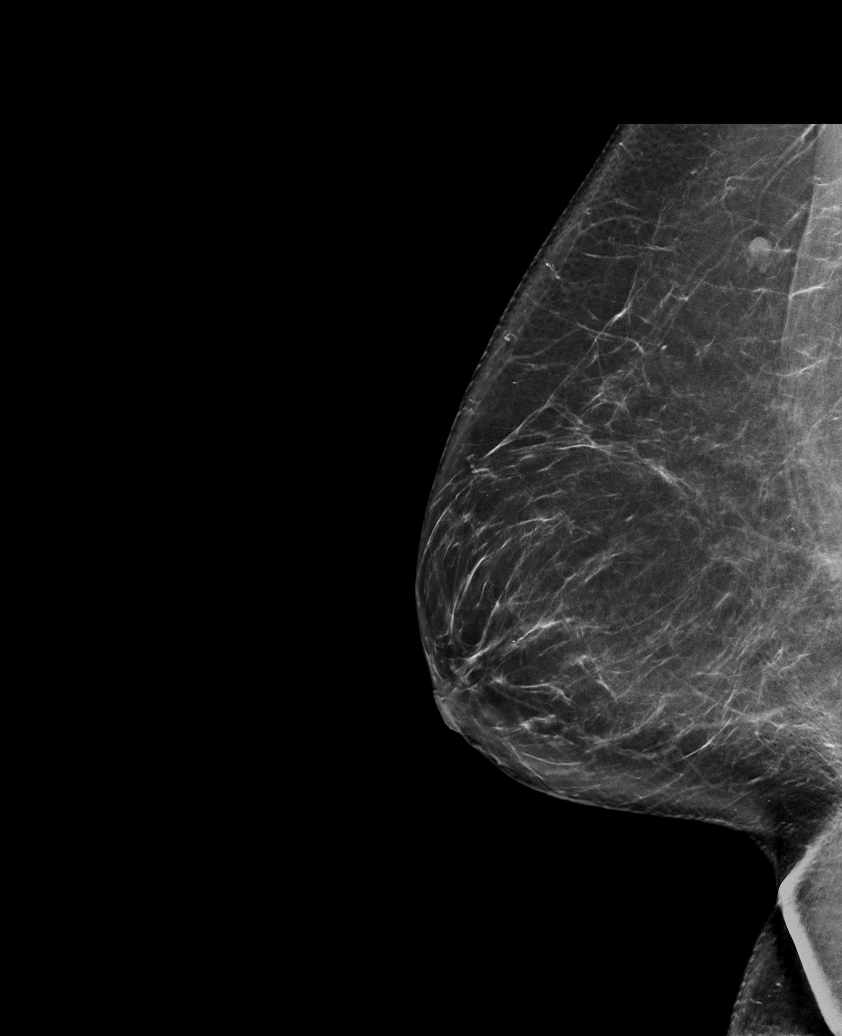

[R MLO synth-2D (2 of 2)]
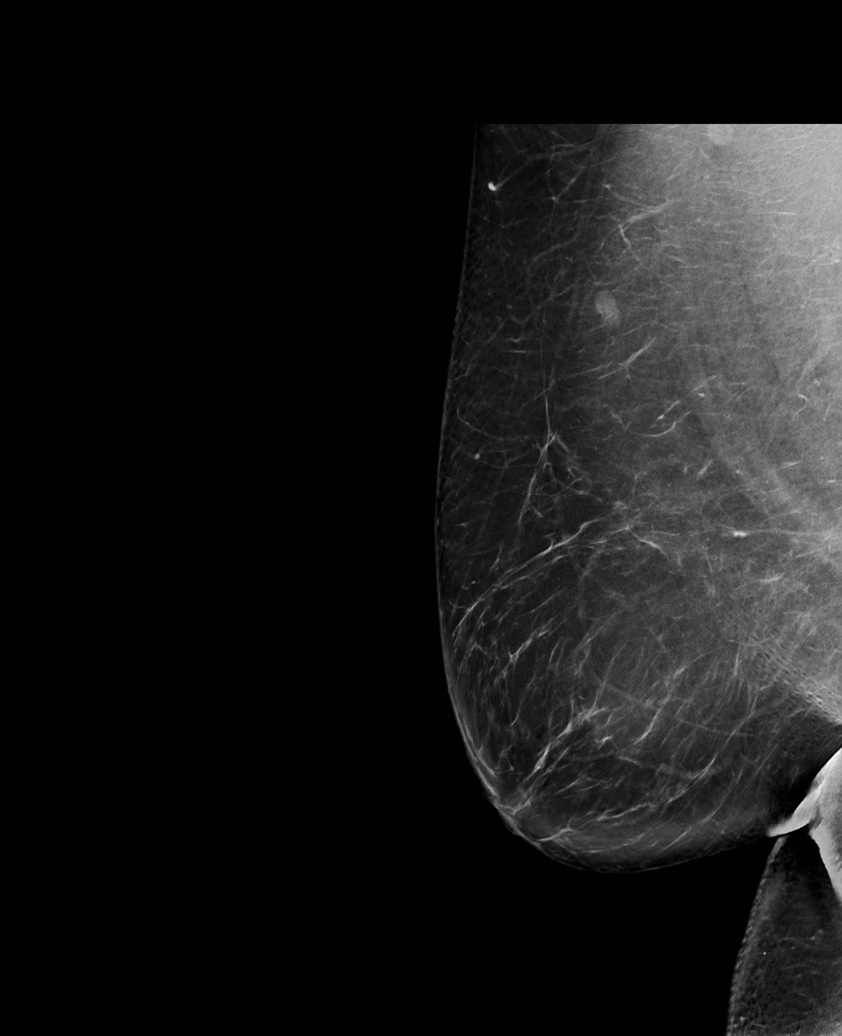

[R CC synth-2D]
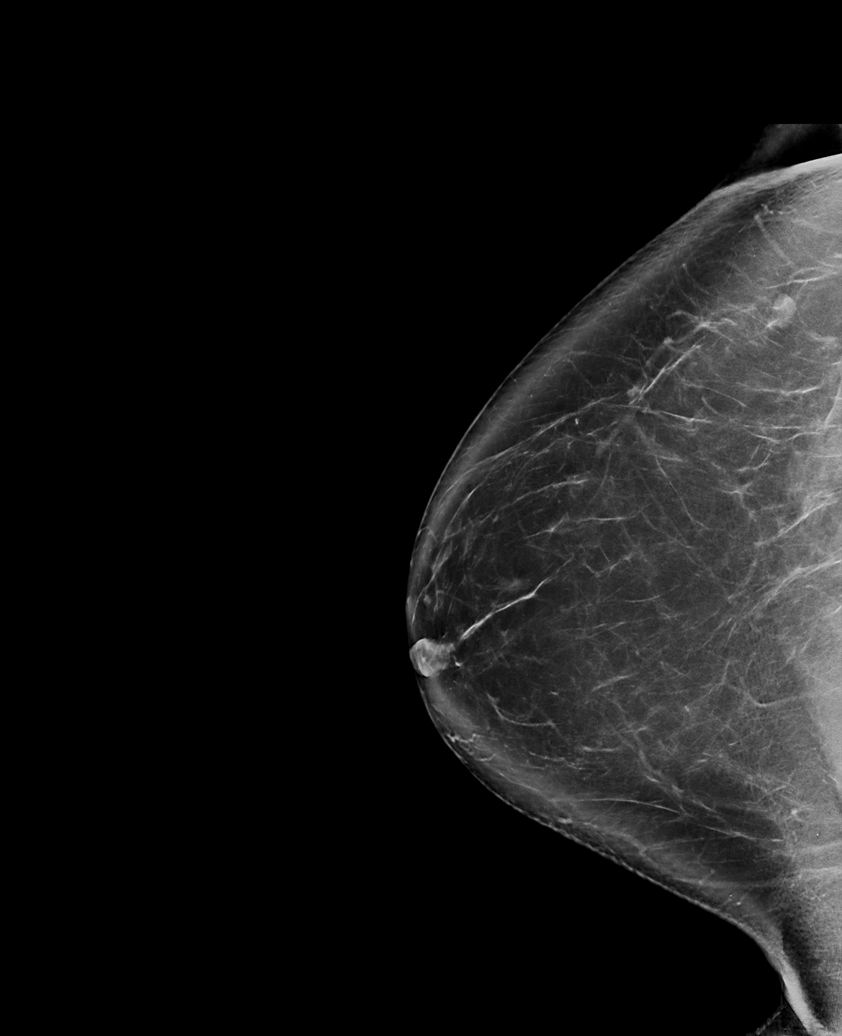

[L CC synth-2D]
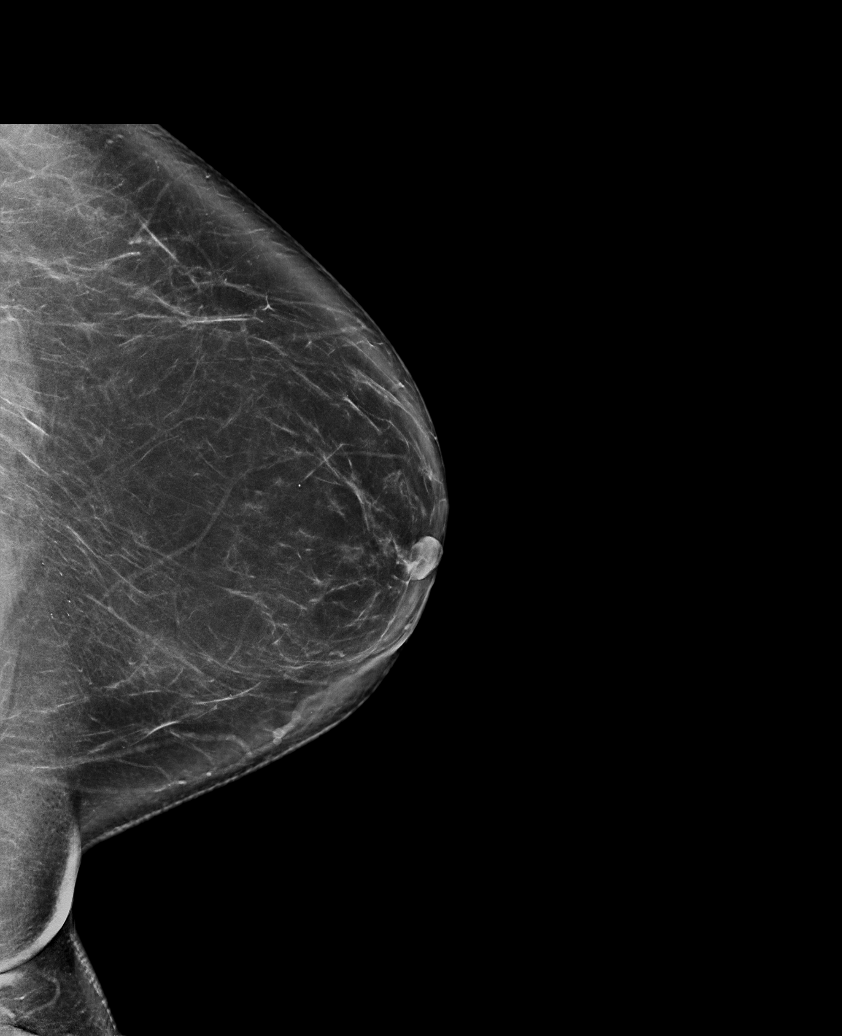

[L MLO synth-2D]
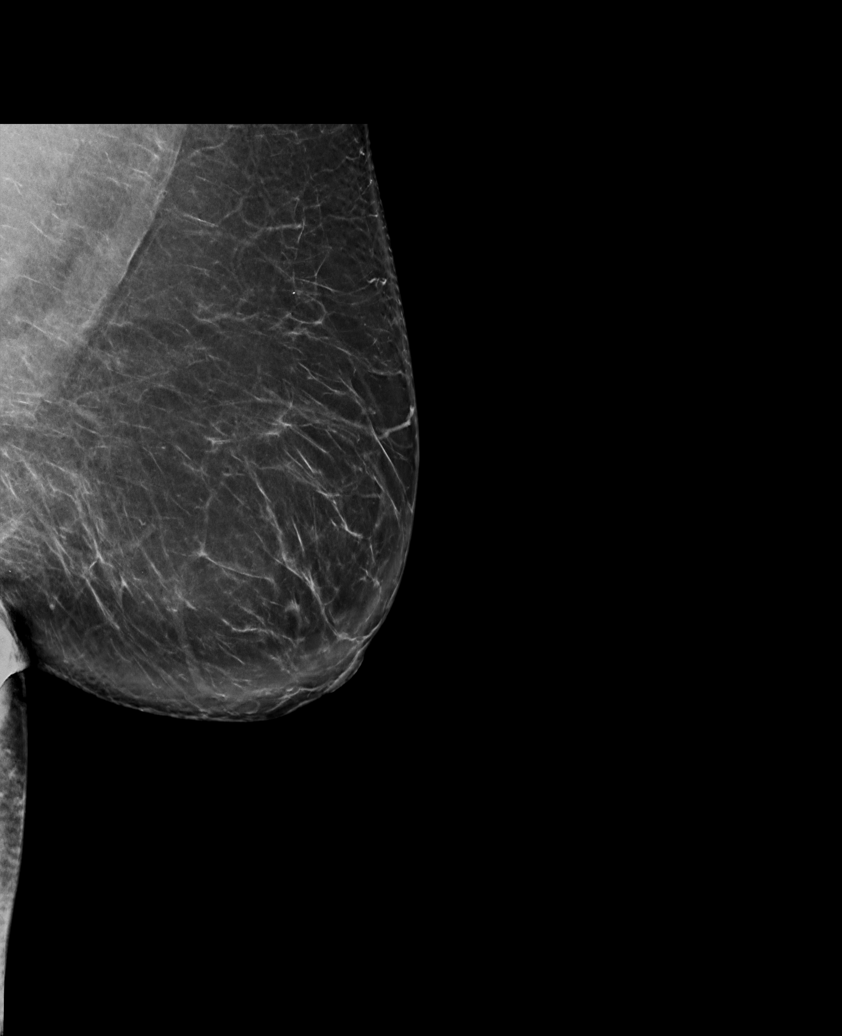

[R CC tomo · tomo slice 51/100.0]
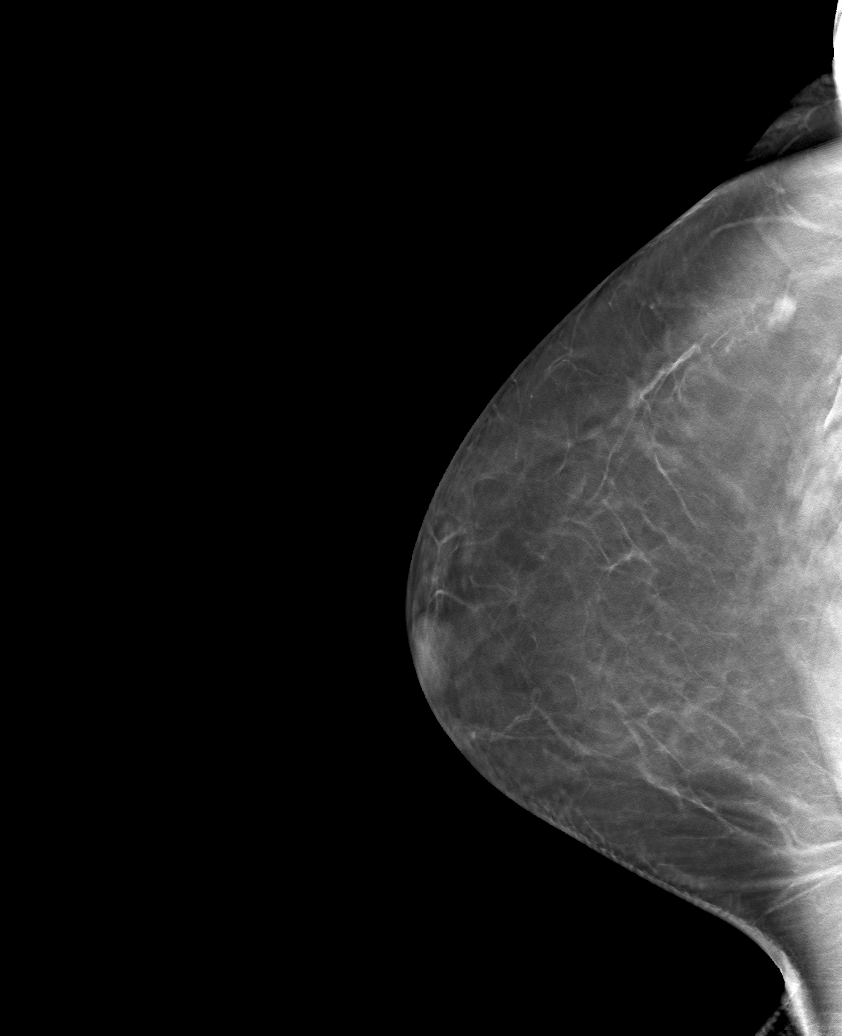

[6 of 30 positions shown; findings below may reference images not displayed]

FINDINGS: There are no findings suspicious for malignancy.
IMPRESSION: No mammographic evidence of malignancy. A result letter of this
screening mammogram will be mailed directly to the patient.

RECOMMENDATION:
Screening mammogram in one year. (Code:44-M-M6Q)

BI-RADS CATEGORY  1: Negative.

## 2022-12-11 ENCOUNTER — Encounter: Payer: Self-pay | Admitting: Internal Medicine

## 2023-01-07 ENCOUNTER — Ambulatory Visit (AMBULATORY_SURGERY_CENTER): Payer: BC Managed Care – PPO

## 2023-01-07 ENCOUNTER — Encounter: Payer: Self-pay | Admitting: Internal Medicine

## 2023-01-07 VITALS — Ht 63.0 in | Wt 220.0 lb

## 2023-01-07 DIAGNOSIS — Z1211 Encounter for screening for malignant neoplasm of colon: Secondary | ICD-10-CM

## 2023-01-07 MED ORDER — NA SULFATE-K SULFATE-MG SULF 17.5-3.13-1.6 GM/177ML PO SOLN: 1.0000 | Freq: Once | ORAL | 0 refills | Status: AC

## 2023-01-07 NOTE — Progress Notes (Signed)
No egg or soy allergy known to patient  No issues known to pt with past sedation with any surgeries or procedures Patient denies ever being told they had issues or difficulty with intubation  No FH of Malignant Hyperthermia Pt is not on diet pills Pt is not on  home 02  Pt is not on blood thinners  Pt denies issues with constipation. Does report not having a regular BM but nothing that bothers her contributes to diet and other factors  No A fib or A flutter Have any cardiac testing pending-- no  LOA: independent  Prep: suprep  Patient's chart reviewed by Cathlyn Parsons CNRA prior to previsit and patient appropriate for the [facility].  Previsit completed and red dot placed by patient's name on their procedure day (on provider's schedule).     PV competed with patient. Prep instructions sent via mychart and home address. Goodrx coupon for [facility] provided to use for price reduction if needed.

## 2023-01-08 ENCOUNTER — Encounter: Payer: Self-pay | Admitting: General Practice

## 2023-01-08 ENCOUNTER — Telehealth: Payer: BC Managed Care – PPO | Admitting: General Practice

## 2023-01-08 VITALS — Ht 63.0 in | Wt 210.0 lb

## 2023-01-08 DIAGNOSIS — J0100 Acute maxillary sinusitis, unspecified: Secondary | ICD-10-CM

## 2023-01-08 MED ORDER — AMOXICILLIN-POT CLAVULANATE 875-125 MG PO TABS: 1.0000 | ORAL_TABLET | Freq: Two times a day (BID) | ORAL | 0 refills | Status: AC

## 2023-01-08 NOTE — Patient Instructions (Signed)
Start Augmentin antibiotics. Take 1 tablet by mouth twice daily for 10 days.  Continue allegra, flonase and tylenol as needed.   Please update if your symptoms are not improving or worse.   It was a pleasure meeting you!

## 2023-01-08 NOTE — Progress Notes (Signed)
Virtual Visit via Video Note  I connected with Erica Rollins on 01/08/23 at 11:40 AM EST by a video enabled telemedicine application and verified that I am speaking with the correct person using two identifiers.  Patient Location: Home Provider Location: Office/Clinic  I discussed the limitations, risks, security, and privacy concerns of performing an evaluation and management service by video and the availability of in person appointments. I also discussed with the patient that there may be a patient responsible charge related to this service. The patient expressed understanding and agreed to proceed.  Subjective: PCP: Judy Pimple, MD  Chief Complaint  Patient presents with   Dizziness    Facial pressure and drainage x 1-2 weeks ago. Taking allergy meds, nasal spray and some tylenol when remembers or needs to take some.    HPI  Symptoms onset 1.5 weeks ago with runny nose, congestion that she contributed to construction at her parents house. She takes daily allergy medication and symptoms have worsened over the last few days. New symptoms include facial pressure, drainage, sinus pain and pressure bilaterally and left sided post nasal drainage. She is not able to blow anything out and feels very dry. Dizziness when she bends her head down to pick up anything or leans back. Does not feel the room spinning. No chest pain, fever, shortness of breath, cough, nausea or vomiting. She has had the flu shot and covid booster in September. She has a history of asthma. She has not checked her fever. She doe shave chills at night. Facial pain, pressure and teeth. Left sided post nasal drainage. She is not able to blow anything out and feels very dry. She is scheduled for a colonoscopy at the end of the month and is hoping that this will not interfere with that.   ROS: Per HPI  Current Outpatient Medications:    albuterol (PROVENTIL HFA;VENTOLIN HFA) 108 (90 Base) MCG/ACT inhaler, Inhale 2 puffs  into the lungs every 6 (six) hours as needed for wheezing or shortness of breath., Disp: 1 Inhaler, Rfl: 5   amoxicillin-clavulanate (AUGMENTIN) 875-125 MG tablet, Take 1 tablet by mouth 2 (two) times daily for 10 days., Disp: 20 tablet, Rfl: 0   Ascorbic Acid (VITAMIN C PO), Take 1 capsule by mouth daily., Disp: , Rfl:    Calcium Carbonate-Vitamin D (CALCIUM + D PO), Take 1 capsule by mouth daily., Disp: , Rfl:    fexofenadine (ALLEGRA) 180 MG tablet, Take 180 mg by mouth daily., Disp: , Rfl:    FIBER PO, Take 4 g by mouth daily., Disp: , Rfl:    FOLIC ACID PO, Take 1 tablet by mouth daily., Disp: , Rfl:    levonorgestrel-ethinyl estradiol (ALTAVERA) 0.15-30 MG-MCG tablet, TAKE 1 TABLET BY MOUTH DAILY. TAKE CONTINUOUSLY, Disp: 84 tablet, Rfl: 3   METRONIDAZOLE, TOPICAL, 0.75 % LOTN, Apply to affected areas of face once daily at bedtime, Disp: 59 mL, Rfl: 3   naproxen sodium (ALEVE) 220 MG tablet, Take 2 tablets by mouth daily as needed., Disp: , Rfl:    OLOPATADINE HCL NA, Place 1 spray into the nose as needed., Disp: , Rfl:    Prenatal Vit-Fe Fumarate-FA (MULTIVITAMIN-PRENATAL) 27-0.8 MG TABS tablet, Take 1 tablet by mouth daily at 12 noon., Disp: , Rfl:   Observations/Objective: Today's Vitals   01/08/23 1140  Weight: 210 lb (95.3 kg)  Height: 5\' 3"  (1.6 m)   Physical Exam  Patient appears well, in no distress. No wheezing or coughing during  interview.  Patient has tenderness when she palpated frontal and maxillary sinuses.  Alert and oriented x 3. Mood, thought and affect normal.   Assessment and Plan: Acute non-recurrent maxillary sinusitis Assessment & Plan: Symptoms suggestive of sinusitis.   Start Start Augmentin antibiotics. Take 1 tablet by mouth twice daily for 10 days.  Discussed to continue nasal spray, allegra and tylenol as needed.   Update if symptoms are not improving or worse.    Other orders -     Amoxicillin-Pot Clavulanate; Take 1 tablet by mouth 2 (two)  times daily for 10 days.  Dispense: 20 tablet; Refill: 0    Follow Up Instructions: Return if symptoms worsen or fail to improve.   I discussed the assessment and treatment plan with the patient. The patient was provided an opportunity to ask questions, and all were answered. The patient agreed with the plan and demonstrated an understanding of the instructions.   The patient was advised to call back or seek an in-person evaluation if the symptoms worsen or if the condition fails to improve as anticipated.  The above assessment and management plan was discussed with the patient. The patient verbalized understanding of and has agreed to the management plan.   [place] Charon, NP

## 2023-01-08 NOTE — Assessment & Plan Note (Addendum)
Symptoms suggestive of sinusitis.   Start Start Augmentin antibiotics. Take 1 tablet by mouth twice daily for 10 days.  Discussed to continue nasal spray, allegra and tylenol as needed.   Update if symptoms are not improving or worse.

## 2023-01-20 ENCOUNTER — Telehealth: Payer: Self-pay | Admitting: Internal Medicine

## 2023-01-20 NOTE — Telephone Encounter (Signed)
Pt said she had her PV early of month. Wanted to make sure her recent antibiotic use was going to stop her from having herf procedure. Informed pt that she is Ok and can have her procedure. No other questions at this time.

## 2023-01-20 NOTE — Telephone Encounter (Signed)
Patient called and stated that she just finished taking an antibiotic and wanted to know if that would interfere with her colonoscopy appointment. She also stated she was taking it the day after her pre-op appointment with the nurse. Patient is requesting a call back. Patient as well mentioned that its okay to leave her a voicemail if she not able to answer. Please advise.

## 2023-01-27 ENCOUNTER — Ambulatory Visit: Payer: BC Managed Care – PPO | Admitting: Internal Medicine

## 2023-01-27 ENCOUNTER — Encounter: Payer: Self-pay | Admitting: Internal Medicine

## 2023-01-27 VITALS — BP 134/86 | HR 69 | Resp 10 | Ht 63.0 in | Wt 220.0 lb

## 2023-01-27 DIAGNOSIS — K648 Other hemorrhoids: Secondary | ICD-10-CM

## 2023-01-27 DIAGNOSIS — D123 Benign neoplasm of transverse colon: Secondary | ICD-10-CM

## 2023-01-27 DIAGNOSIS — K635 Polyp of colon: Secondary | ICD-10-CM | POA: Diagnosis not present

## 2023-01-27 DIAGNOSIS — Z1211 Encounter for screening for malignant neoplasm of colon: Secondary | ICD-10-CM | POA: Diagnosis present

## 2023-01-27 DIAGNOSIS — D122 Benign neoplasm of ascending colon: Secondary | ICD-10-CM

## 2023-01-27 MED ORDER — SODIUM CHLORIDE 0.9 % IV SOLN: 500.0000 mL | Freq: Once | INTRAVENOUS | Status: DC

## 2023-01-27 NOTE — Progress Notes (Signed)
Vss nad mtrans to pacu

## 2023-01-27 NOTE — Patient Instructions (Addendum)
Await pathology results.  Continue your normal medications  Please read over handouts provided   YOU HAD AN ENDOSCOPIC PROCEDURE TODAY AT THE [facility]:   Refer to the procedure report that was given to you for any specific questions about what was found during the examination.  If the procedure report does not answer your questions, please call your gastroenterologist to clarify.  If you requested that your care partner not be given the details of your procedure findings, then the procedure report has been included in a sealed envelope for you to review at your convenience later.  YOU SHOULD EXPECT: Some feelings of bloating in the abdomen. Passage of more gas than usual.  Walking can help get rid of the air that was put into your GI tract during the procedure and reduce the bloating. If you had a lower endoscopy (such as a colonoscopy or flexible sigmoidoscopy) you may notice spotting of blood in your stool or on the toilet paper. If you underwent a bowel prep for your procedure, you may not have a normal bowel movement for a few days.  Please Note:  You might notice some irritation and congestion in your nose or some drainage.  This is from the oxygen used during your procedure.  There is no need for concern and it should clear up in a day or so.  SYMPTOMS TO REPORT IMMEDIATELY:  Following lower endoscopy (colonoscopy or flexible sigmoidoscopy):  Excessive amounts of blood in the stool  Significant tenderness or worsening of abdominal pains  Swelling of the abdomen that is new, acute  Fever of 100F or higher For urgent or emergent issues, a gastroenterologist can be reached at any hour by calling [phone]. Do not use MyChart messaging for urgent concerns.    DIET:  We do recommend a small meal at first, but then you may proceed to your regular diet.  Drink plenty of fluids but you should avoid alcoholic beverages for 24 hours.  ACTIVITY:  You should plan to take it  easy for the rest of today and you should NOT DRIVE or use heavy machinery until tomorrow (because of the sedation medicines used during the test).    FOLLOW UP: Our staff will call the number listed on your records the next business day following your procedure.  We will call around 7:15- 8:00 am to check on you and address any questions or concerns that you may have regarding the information given to you following your procedure. If we do not reach you, we will leave a message.     If any biopsies were taken you will be contacted by phone or by letter within the next 1-3 weeks.  Please call us at [phone] if you have not heard about the biopsies in 3 weeks.    SIGNATURES/CONFIDENTIALITY: You and/or your care partner have signed paperwork which will be entered into your electronic medical record.  These signatures attest to the fact that that the information above on your After Visit Summary has been reviewed and is understood.  Full responsibility of the confidentiality of this discharge information lies with you and/or your care-partner.

## 2023-01-27 NOTE — Progress Notes (Signed)
Pt's states no medical or surgical changes since previsit or office visit. 

## 2023-01-27 NOTE — Op Note (Signed)
[facility] Patient Name: Erica Rollins Procedure Date: 01/27/2023 8:07 AM MRN: [id number] Endoscopist: Madelyn Brunner Oakley , , [id number] Age: 47 Referring MD:  Date of Birth: 1976-01-03 Gender: Female Account #: [id number] Procedure:                Colonoscopy Indications:              Screening for colorectal malignant neoplasm, This                            is the patient's first colonoscopy Medicines:                Monitored Anesthesia Care Procedure:                Pre-Anesthesia Assessment:                           - Prior to the procedure, a History and Physical                            was performed, and patient medications and                            allergies were reviewed. The patient's tolerance of                            previous anesthesia was also reviewed. The risks                            and benefits of the procedure and the sedation                            options and risks were discussed with the patient.                            All questions were answered, and informed consent                            was obtained. Prior Anticoagulants: The patient has                            taken no anticoagulant or antiplatelet agents. ASA                            Grade Assessment: II - A patient with mild systemic                            disease. After reviewing the risks and benefits,                            the patient was deemed in satisfactory condition to                            undergo the procedure.  After obtaining informed consent, the colonoscope                            was passed under direct vision. Throughout the                            procedure, the patient's blood pressure, pulse, and                            oxygen saturations were monitored continuously. The                            CF [id number] #[id number] was introduced through the anus                            and advanced to  the the terminal ileum. The                            colonoscopy was performed without difficulty. The                            patient tolerated the procedure well. The quality                            of the bowel preparation was good. The terminal                            ileum, ileocecal valve, appendiceal orifice, and                            rectum were photographed. Scope In: 8:27:42 AM Scope Out: 8:41:25 AM Scope Withdrawal Time: 0 hours 9 minutes 28 seconds  Total Procedure Duration: 0 hours 13 minutes 43 seconds  Findings:                 The terminal ileum appeared normal.                           Two sessile polyps were found in the transverse                            colon and ascending colon. The polyps were 3 to 6                            mm in size. These polyps were removed with a cold                            snare. Resection and retrieval were complete.                           Non-bleeding internal hemorrhoids were found during                            retroflexion. Complications:  No immediate complications. Estimated Blood Loss:     Estimated blood loss was minimal. Impression:               - The examined portion of the ileum was normal.                           - Two 3 to 6 mm polyps in the transverse colon and                            in the ascending colon, removed with a cold snare.                            Resected and retrieved.                           - Non-bleeding internal hemorrhoids. Recommendation:           - Discharge patient to home (with escort).                           - Await pathology results.                           - The findings and recommendations were discussed                            with the patient. Dr Particia Lather "[place]" Girard,  01/27/2023 8:50:02 AM

## 2023-01-27 NOTE — Progress Notes (Signed)
GASTROENTEROLOGY PROCEDURE H&P NOTE   Primary Care Physician: Tower, Audrie Gallus, MD    Reason for Procedure:   Colon cancer screening  Plan:    Colonoscopy  Patient is appropriate for endoscopic procedure(s) in the ambulatory ([facility]) setting.  The nature of the procedure, as well as the risks, benefits, and alternatives were carefully and thoroughly reviewed with the patient. Ample time for discussion and questions allowed. The patient understood, was satisfied, and agreed to proceed.     HPI: Erica Rollins is a 47 y.o. female who presents for colonoscopy for colon cancer screening. Denies blood in stools, changes in bowel habits, or unintentional weight loss. Denies family history of colon cancer.  Past Medical History:  Diagnosis Date   Allergy    Asthma, mild    High plasma total homocysteine    History of foot fracture    Rosacea     Past Surgical History:  Procedure Laterality Date   APPENDECTOMY     CESAREAN SECTION  2007    Prior to Admission medications   Medication Sig Start Date End Date Taking? Authorizing Provider  Ascorbic Acid (VITAMIN C PO) Take 1 capsule by mouth daily.   Yes [provider]  Calcium Carbonate-Vitamin D (CALCIUM + D PO) Take 1 capsule by mouth daily.   Yes [provider]  fexofenadine (ALLEGRA) 180 MG tablet Take 180 mg by mouth daily.   Yes [provider]  FIBER PO Take 4 g by mouth daily.   Yes [provider]  FOLIC ACID PO Take 1 tablet by mouth daily.   Yes [provider]  levonorgestrel-ethinyl estradiol (ALTAVERA) 0.15-30 MG-MCG tablet TAKE 1 TABLET BY MOUTH DAILY. TAKE CONTINUOUSLY 11/27/22  Yes Tower, Audrie Gallus, MD  naproxen sodium (ALEVE) 220 MG tablet Take 2 tablets by mouth daily as needed.   Yes [provider]  OLOPATADINE HCL NA Place 1 spray into the nose as needed.   Yes [provider]  Prenatal Vit-Fe Fumarate-FA (MULTIVITAMIN-PRENATAL) 27-0.8 MG TABS  tablet Take 1 tablet by mouth daily at 12 noon.   Yes [provider]  albuterol (PROVENTIL HFA;VENTOLIN HFA) 108 (90 Base) MCG/ACT inhaler Inhale 2 puffs into the lungs every 6 (six) hours as needed for wheezing or shortness of breath. 11/03/17   Tower, Audrie Gallus, MD  METRONIDAZOLE, TOPICAL, 0.75 % LOTN Apply to affected areas of face once daily at bedtime 10/08/19   Tower, Audrie Gallus, MD    Current Outpatient Medications  Medication Sig Dispense Refill   Ascorbic Acid (VITAMIN C PO) Take 1 capsule by mouth daily.     Calcium Carbonate-Vitamin D (CALCIUM + D PO) Take 1 capsule by mouth daily.     fexofenadine (ALLEGRA) 180 MG tablet Take 180 mg by mouth daily.     FIBER PO Take 4 g by mouth daily.     FOLIC ACID PO Take 1 tablet by mouth daily.     levonorgestrel-ethinyl estradiol (ALTAVERA) 0.15-30 MG-MCG tablet TAKE 1 TABLET BY MOUTH DAILY. TAKE CONTINUOUSLY 84 tablet 3   naproxen sodium (ALEVE) 220 MG tablet Take 2 tablets by mouth daily as needed.     OLOPATADINE HCL NA Place 1 spray into the nose as needed.     Prenatal Vit-Fe Fumarate-FA (MULTIVITAMIN-PRENATAL) 27-0.8 MG TABS tablet Take 1 tablet by mouth daily at 12 noon.     albuterol (PROVENTIL HFA;VENTOLIN HFA) 108 (90 Base) MCG/ACT inhaler Inhale 2 puffs into the lungs every 6 (six)  hours as needed for wheezing or shortness of breath. 1 Inhaler 5   METRONIDAZOLE, TOPICAL, 0.75 % LOTN Apply to affected areas of face once daily at bedtime 59 mL 3   Current Facility-Administered Medications  Medication Dose Route Frequency Provider Last Rate Last Admin   0.9 %  sodium chloride infusion  500 mL Intravenous Once Imogene Burn, MD        Allergies as of 01/27/2023 - Review Complete 01/27/2023  Allergen Reaction Noted   Mtx topical pain [lidocaine-menthol]  11/22/2021   Peanut (diagnostic) Rash 01/07/2023    Family History  Problem Relation Age of Onset   Cancer Mother    Breast cancer Mother 29 - 11   Stroke Paternal Uncle     Arthritis Maternal Grandmother    Stroke Paternal Grandmother    Heart disease Paternal Grandfather    Colon polyps Neg Hx    Colon cancer Neg Hx    Esophageal cancer Neg Hx    Rectal cancer Neg Hx    Stomach cancer Neg Hx     Social History   Socioeconomic History   Marital status: Married    Spouse name: Not on file   Number of children: Not on file   Years of education: Not on file   Highest education level: Not on file  Occupational History   Not on file  Tobacco Use   Smoking status: Never   Smokeless tobacco: Never  Vaping Use   Vaping status: Never Used  Substance and Sexual Activity   Alcohol use: No    Alcohol/week: 0.0 standard drinks of alcohol   Drug use: No   Sexual activity: Not on file  Other Topics Concern   Not on file  Social History Narrative   Pre K teacher    Married   One child - with spina bifida (in grade school)       [facility]    Non smoker   Diet is fair       Social Drivers of Corporate investment banker Strain: Not on file  Food Insecurity: Not on file  Transportation Needs: Not on file  Physical Activity: Not on file  Stress: Not on file  Social Connections: Not on file  Intimate Partner Violence: Not on file    Physical Exam: Vital signs in last 24 hours: BP (!) 143/93   Pulse 64   Resp 16   Ht 5\' 3"  (1.6 m)   Wt 220 lb (99.8 kg)   SpO2 100%   BMI 38.97 kg/m  GEN: NAD EYE: Sclerae anicteric ENT: MMM CV: Non-tachycardic Pulm: No increased work of breathing GI: Soft, NT/ND NEURO:  Alert & Oriented   Erica Pont, MD [facility]  01/27/2023 8:25 AM

## 2023-01-27 NOTE — Progress Notes (Signed)
Called to room to assist during endoscopic procedure.  Patient ID and intended procedure confirmed with present staff. Received instructions for my participation in the procedure from the performing physician.  

## 2023-01-28 ENCOUNTER — Telehealth: Payer: Self-pay

## 2023-01-28 NOTE — Telephone Encounter (Signed)
  Follow up Call-     01/27/2023    7:17 AM  Call back number  Post procedure Call Back phone  # [phone]  Permission to leave phone message Yes     Patient questions:  Do you have a fever, pain , or abdominal swelling? No. Pain Score  0 *  Have you tolerated food without any problems? Yes.    Have you been able to return to your normal activities? Yes.    Do you have any questions about your discharge instructions: Diet   No. Medications  No. Follow up visit  No.  Do you have questions or concerns about your Care? No.  Actions: * If pain score is 4 or above: No action needed, pain <4.

## 2023-02-03 ENCOUNTER — Encounter: Payer: Self-pay | Admitting: Internal Medicine

## 2023-02-03 LAB — SURGICAL PATHOLOGY

## 2023-04-23 ENCOUNTER — Encounter: Payer: Self-pay | Admitting: Family Medicine

## 2023-05-15 ENCOUNTER — Telehealth (INDEPENDENT_AMBULATORY_CARE_PROVIDER_SITE_OTHER): Payer: Self-pay | Admitting: Family Medicine

## 2023-05-15 VITALS — HR 90 | Temp 97.3°F | Ht 63.0 in | Wt 214.0 lb

## 2023-05-15 DIAGNOSIS — J301 Allergic rhinitis due to pollen: Secondary | ICD-10-CM

## 2023-05-15 DIAGNOSIS — J0100 Acute maxillary sinusitis, unspecified: Secondary | ICD-10-CM | POA: Diagnosis not present

## 2023-05-15 DIAGNOSIS — J309 Allergic rhinitis, unspecified: Secondary | ICD-10-CM | POA: Insufficient documentation

## 2023-05-15 MED ORDER — AMOXICILLIN-POT CLAVULANATE 875-125 MG PO TABS: 1.0000 | ORAL_TABLET | Freq: Two times a day (BID) | ORAL | 0 refills | Status: DC

## 2023-05-15 MED ORDER — FLUTICASONE PROPIONATE 50 MCG/ACT NA SUSP: 2.0000 | Freq: Every day | NASAL | 6 refills | Status: AC

## 2023-05-15 NOTE — Assessment & Plan Note (Signed)
 After 1-2 weeks of allergy congestion (and /or uri)  Facial pain/ purulent nasal discharge   Disc symptomatic care - see instructions on AVS  Augmentin  bid 7d  Add back steroid ns  Update if not starting to improve in a week or if worsening  Call back and Er precautions noted in detail today   Handout given

## 2023-05-15 NOTE — Progress Notes (Signed)
 Virtual Visit via Video Note  I connected with [name] on 05/15/23 at 12:00 PM EDT by a video enabled telemedicine application and verified that I am speaking with the correct person using two identifiers.  Patient Location: Other:  work Systems developer: Office/Clinic  I discussed the limitations, risks, security, and privacy concerns of performing an evaluation and management service by video and the availability of in person appointments. I also discussed with the patient that there may be a patient responsible charge related to this service. The patient expressed understanding and agreed to proceed.  Parties involved in encounter  Patient: Erica Rollins  Provider:  Roxy Manns MD   Subjective: PCP: Judy Pimple, MD  Chief Complaint  Patient presents with   Sinusitis    X1 week ago   HPI Symptoms for over a week  Yard work/pollen-forgot to take her allergy medicine   Scratchy throat Then nasal congestion  Got worse  Mucous is pale green  Bad sinus pain and pressure  Mostly below the eyes / a little above  Today left side is worse  Some tenderness   Ears feel full  Throat is not as scratchy  Hoarse voice  A cough- dry / not severe    No fever  Did have chills a few days ago but not fever   Some nausea from pnd No vomiting or diarrhea    Over the counter Mucinex sinus  Tylenol cold and sinus   Allegra  Nasal spray - steroid    Does not have nasal saline     ROS: Per HPI Review of Systems  Constitutional:  Positive for chills and malaise/fatigue. Negative for fever.  HENT:  Positive for congestion and sinus pain. Negative for ear pain, hearing loss and sore throat.   Respiratory:  Positive for cough. Negative for sputum production, shortness of breath, wheezing and stridor.   Gastrointestinal:  Positive for nausea. Negative for diarrhea and vomiting.  Neurological:  Positive for headaches.     Current Outpatient Medications:     albuterol (PROVENTIL HFA;VENTOLIN HFA) 108 (90 Base) MCG/ACT inhaler, Inhale 2 puffs into the lungs every 6 (six) hours as needed for wheezing or shortness of breath., Disp: 1 Inhaler, Rfl: 5   amoxicillin-clavulanate (AUGMENTIN) 875-125 MG tablet, Take 1 tablet by mouth 2 (two) times daily., Disp: 14 tablet, Rfl: 0   Ascorbic Acid (VITAMIN C PO), Take 1 capsule by mouth daily., Disp: , Rfl:    Calcium Carbonate-Vitamin D (CALCIUM + D PO), Take 1 capsule by mouth daily., Disp: , Rfl:    fexofenadine (ALLEGRA) 180 MG tablet, Take 180 mg by mouth daily., Disp: , Rfl:    FIBER PO, Take 4 g by mouth daily., Disp: , Rfl:    fluticasone (FLONASE) 50 MCG/ACT nasal spray, Place 2 sprays into both nostrils daily., Disp: 16 g, Rfl: 6   FOLIC ACID PO, Take 1 tablet by mouth daily., Disp: , Rfl:    levonorgestrel-ethinyl estradiol (ALTAVERA) 0.15-30 MG-MCG tablet, TAKE 1 TABLET BY MOUTH DAILY. TAKE CONTINUOUSLY, Disp: 84 tablet, Rfl: 3   METRONIDAZOLE, TOPICAL, 0.75 % LOTN, Apply to affected areas of face once daily at bedtime, Disp: 59 mL, Rfl: 3   naproxen sodium (ALEVE) 220 MG tablet, Take 2 tablets by mouth daily as needed., Disp: , Rfl:    OLOPATADINE HCL NA, Place 1 spray into the nose as needed., Disp: , Rfl:    Prenatal Vit-Fe Fumarate-FA (MULTIVITAMIN-PRENATAL) 27-0.8 MG TABS  tablet, Take 1 tablet by mouth daily at 12 noon., Disp: , Rfl:   Observations/Objective: Today's Vitals   05/15/23 1208  Pulse: 90  Temp: (!) 97.3 F (36.3 C)  Weight: 214 lb (97.1 kg)  Height: 5\' 3"  (1.6 m)   Physical Exam Patient appears well, in no distress Weight is baseline  No facial swelling or asymmetry Some tenderness to self palp of maxillary area , worse on left  Hoarse voice  No obvious tremor or mobility impairment Moving neck and UEs normally Able to hear the call well  No cough or shortness of breath during interview  Talkative and mentally sharp with no cognitive changes No skin changes on face  or neck , no rash or pallor Affect is normal   Assessment and Plan: Acute non-recurrent maxillary sinusitis Assessment & Plan: After 1-2 weeks of allergy congestion (and /or uri)  Facial pain/ purulent nasal discharge   Disc symptomatic care - see instructions on AVS  Augmentin  bid 7d  Add back steroid ns  Update if not starting to improve in a week or if worsening  Call back and Er precautions noted in detail today   Handout given    Seasonal allergic rhinitis due to pollen Assessment & Plan: Discussed pollen avoidance when able   Back on allegra Encouraged to start on nasal steroid/ flonase sent   Nasal saline prn after pollen exposure  Handout given  Call back and Er precautions noted in detail today     Other orders -     Amoxicillin-Pot Clavulanate; Take 1 tablet by mouth 2 (two) times daily.  Dispense: 14 tablet; Refill: 0 -     Fluticasone Propionate; Place 2 sprays into both nostrils daily.  Dispense: 16 g; Refill: 6    Follow Up Instructions: No follow-ups on file.  Try nasal saline spray or netti pot for congestion  Drink fluids   Continue your allergy medicine  Start flonase (or other steroid nasal spray) daily through allergy season   For bacterial sinus infection- take augmentin as directed   Update if not starting to improve in a week or if worsening    If any severe symptoms (unbearable headache, trouble breathing) -go to the ER I discussed the assessment and treatment plan with the patient. The patient was provided an opportunity to ask questions, and all were answered. The patient agreed with the plan and demonstrated an understanding of the instructions.   The patient was advised to call back or seek an in-person evaluation if the symptoms worsen or if the condition fails to improve as anticipated.  The above assessment and management plan was discussed with the patient. The patient verbalized understanding of and has agreed to the management  plan.   Roxy Manns, MD

## 2023-05-15 NOTE — Assessment & Plan Note (Signed)
 Discussed pollen avoidance when able   Back on allegra Encouraged to start on nasal steroid/ flonase sent   Nasal saline prn after pollen exposure  Handout given  Call back and Er precautions noted in detail today

## 2023-05-15 NOTE — Patient Instructions (Signed)
 Try nasal saline spray or netti pot for congestion  Drink fluids   Continue your allergy medicine  Start flonase (or other steroid nasal spray) daily through allergy season   For bacterial sinus infection- take augmentin as directed   Update if not starting to improve in a week or if worsening    If any severe symptoms (unbearable headache, trouble breathing) -go to the ER

## 2023-06-25 ENCOUNTER — Encounter: Payer: Self-pay | Admitting: Family Medicine

## 2023-06-25 ENCOUNTER — Telehealth (INDEPENDENT_AMBULATORY_CARE_PROVIDER_SITE_OTHER): Admitting: Family Medicine

## 2023-06-25 DIAGNOSIS — J0100 Acute maxillary sinusitis, unspecified: Secondary | ICD-10-CM | POA: Diagnosis not present

## 2023-06-25 DIAGNOSIS — J301 Allergic rhinitis due to pollen: Secondary | ICD-10-CM

## 2023-06-25 MED ORDER — AMOXICILLIN-POT CLAVULANATE 875-125 MG PO TABS: 1.0000 | ORAL_TABLET | Freq: Two times a day (BID) | ORAL | 0 refills | Status: DC

## 2023-06-25 NOTE — Patient Instructions (Signed)
 Take augmentin  as directed  Continue allergy medicines Continue nasal saline  Try breathing steam  Warm compresses on face are also helpful  Update if not starting to improve in a week or if worsening  Follow up in office for exam if worse or not improving

## 2023-06-25 NOTE — Assessment & Plan Note (Signed)
 Continues allegra and flonase  in season

## 2023-06-25 NOTE — Assessment & Plan Note (Addendum)
 Symptoms briefly improved from April and then returned worse More green thick nasal d/c despite using saline Also foul smell/taste coming from sinuses  More facial pressure and pain  Suspect bacterial  Prescription augmentin -will repeat course Continue other meds Add steam/warm compresses  Disc symptomatic care - see instructions on AVS  Update if not starting to improve in a week or if worsening  Absolutely needs in office visit if this does not improve!  Call back and Er precautions noted in detail today

## 2023-06-25 NOTE — Progress Notes (Signed)
 Virtual Visit via Video Note  I connected with [name] on 06/25/23 at 12:00 PM EDT by a video enabled telemedicine application and verified that I am speaking with the correct person using two identifiers.  Patient Location: Home Provider Location: Office/Clinic  I discussed the limitations, risks, security, and privacy concerns of performing an evaluation and management service by video and the availability of in person appointments. I also discussed with the patient that there may be a patient responsible charge related to this service. The patient expressed understanding and agreed to proceed.  Parties involved in encounter  Patient: Erica Rollins   Provider:  Deri Fleet MD   Subjective: PCP: Clemens Curt, MD  Chief Complaint  Patient presents with   Sinusitis    t   Nasal Congestion   facial pressure   HPI Pt presents with sinus symptoms and nasal congestion   Was seen on 4/10 virtually    A/p Acute non-recurrent maxillary sinusitis Assessment & Plan: After 1-2 weeks of allergy congestion (and /or uri)  Facial pain/ purulent nasal discharge    Disc symptomatic care - see instructions on AVS  Augmentin   bid 7d  Add back steroid ns  Update if not starting to improve in a week or if worsening  Call back and Er precautions noted in detail today   Handout given      Got better for a week - improved a little but very fatigued  Then got worse again   Now pressure over maxillary sinuses and over nose Is tender  Pnd with some redness of throat  Mucous is green from nose  Funny smell/taste Some blood also  Ears feel full off and on  Glands in neck were tender/not not   No fever  Really fatigued (also working long hours)   Cough is better   Over the counter  No longer using cough syrup  Allegra  Flonase  Saline spray  Ibuprofen   Has not done covid test     ROS: Per HPI Review of Systems  Constitutional:  Positive for malaise/fatigue.  Negative for chills and fever.  HENT:  Positive for congestion and sinus pain. Negative for ear pain and sore throat.   Eyes:  Negative for blurred vision, discharge and redness.  Respiratory:  Positive for cough. Negative for shortness of breath and stridor.   Cardiovascular:  Negative for chest pain, palpitations and leg swelling.  Gastrointestinal:  Negative for abdominal pain, diarrhea, nausea and vomiting.  Musculoskeletal:  Negative for myalgias.  Skin:  Negative for rash.  Neurological:  Positive for headaches. Negative for dizziness.     Current Outpatient Medications:    albuterol  (PROVENTIL  HFA;VENTOLIN  HFA) 108 (90 Base) MCG/ACT inhaler, Inhale 2 puffs into the lungs every 6 (six) hours as needed for wheezing or shortness of breath., Disp: 1 Inhaler, Rfl: 5   amoxicillin -clavulanate (AUGMENTIN ) 875-125 MG tablet, Take 1 tablet by mouth 2 (two) times daily., Disp: 14 tablet, Rfl: 0   Calcium Carbonate-Vitamin D (CALCIUM + D PO), Take 1 capsule by mouth daily., Disp: , Rfl:    fexofenadine (ALLEGRA) 180 MG tablet, Take 180 mg by mouth daily., Disp: , Rfl:    FIBER PO, Take 4 g by mouth daily., Disp: , Rfl:    fluticasone  (FLONASE ) 50 MCG/ACT nasal spray, Place 2 sprays into both nostrils daily., Disp: 16 g, Rfl: 6   FOLIC ACID PO, Take 1 tablet by mouth daily., Disp: , Rfl:  ibuprofen (ADVIL) 200 MG tablet, Take 400 mg by mouth in the morning., Disp: , Rfl:    levonorgestrel -ethinyl estradiol (ALTAVERA) 0.15-30 MG-MCG tablet, TAKE 1 TABLET BY MOUTH DAILY. TAKE CONTINUOUSLY, Disp: 84 tablet, Rfl: 3   METRONIDAZOLE , TOPICAL, 0.75 % LOTN, Apply to affected areas of face once daily at bedtime, Disp: 59 mL, Rfl: 3   Prenatal Vit-Fe Fumarate-FA (MULTIVITAMIN-PRENATAL) 27-0.8 MG TABS tablet, Take 1 tablet by mouth daily at 12 noon., Disp: , Rfl:   Observations/Objective: There were no vitals filed for this visit. Physical Exam  Patient appears well, in no distress Weight is baseline   No facial swelling or asymmetry Normal voice-not hoarse and no slurred speech Sounds nasally congested  Notes facial tenderness on self palpation under eyes and around nasal bridge  No obvious tremor or mobility impairment Moving neck and UEs normally Able to hear the call well  No cough or shortness of breath during interview  Talkative and mentally sharp with no cognitive changes No skin changes on face or neck , no rash or pallor Affect is normal   Assessment and Plan: Acute non-recurrent maxillary sinusitis Assessment & Plan: Symptoms briefly improved from April and then returned worse More green thick nasal d/c despite using saline Also foul smell/taste coming from sinuses  More facial pressure and pain  Suspect bacterial  Prescription augmentin -will repeat course Continue other meds Add steam/warm compresses  Disc symptomatic care - see instructions on AVS  Update if not starting to improve in a week or if worsening  Absolutely needs in office visit if this does not improve!  Call back and Er precautions noted in detail today      Seasonal allergic rhinitis due to pollen Assessment & Plan: Continues allegra and flonase  in season    Other orders -     Amoxicillin -Pot Clavulanate; Take 1 tablet by mouth 2 (two) times daily.  Dispense: 14 tablet; Refill: 0    Follow Up Instructions: No follow-ups on file.  Take augmentin  as directed  Continue allergy medicines Continue nasal saline  Try breathing steam  Warm compresses on face are also helpful  Update if not starting to improve in a week or if worsening  Follow up in office for exam if worse or not improving  I discussed the assessment and treatment plan with the patient. The patient was provided an opportunity to ask questions, and all were answered. The patient agreed with the plan and demonstrated an understanding of the instructions.   The patient was advised to call back or seek an in-person evaluation  if the symptoms worsen or if the condition fails to improve as anticipated.  The above assessment and management plan was discussed with the patient. The patient verbalized understanding of and has agreed to the management plan.   Deri Fleet, MD

## 2023-07-14 ENCOUNTER — Encounter: Payer: Self-pay | Admitting: Family Medicine

## 2023-07-15 NOTE — Telephone Encounter (Signed)
 Please see if there is anything earlier with me or first available and reach out to her  Thanks

## 2023-07-16 NOTE — Telephone Encounter (Signed)
 Spoke with pt she's going to keep her appt om 6/20

## 2023-07-25 ENCOUNTER — Encounter: Payer: Self-pay | Admitting: Family Medicine

## 2023-07-25 ENCOUNTER — Ambulatory Visit (INDEPENDENT_AMBULATORY_CARE_PROVIDER_SITE_OTHER): Admitting: Family Medicine

## 2023-07-25 VITALS — BP 126/82 | HR 72 | Temp 98.0°F | Ht 63.0 in | Wt 216.1 lb

## 2023-07-25 DIAGNOSIS — J0100 Acute maxillary sinusitis, unspecified: Secondary | ICD-10-CM | POA: Diagnosis not present

## 2023-07-25 DIAGNOSIS — M545 Low back pain, unspecified: Secondary | ICD-10-CM | POA: Diagnosis not present

## 2023-07-25 DIAGNOSIS — R8290 Unspecified abnormal findings in urine: Secondary | ICD-10-CM | POA: Insufficient documentation

## 2023-07-25 DIAGNOSIS — N898 Other specified noninflammatory disorders of vagina: Secondary | ICD-10-CM | POA: Diagnosis not present

## 2023-07-25 DIAGNOSIS — S30860A Insect bite (nonvenomous) of lower back and pelvis, initial encounter: Secondary | ICD-10-CM | POA: Insufficient documentation

## 2023-07-25 DIAGNOSIS — W57XXXA Bitten or stung by nonvenomous insect and other nonvenomous arthropods, initial encounter: Secondary | ICD-10-CM

## 2023-07-25 LAB — POC URINALSYSI DIPSTICK (AUTOMATED)
Bilirubin, UA: NEGATIVE
Blood, UA: NEGATIVE
Glucose, UA: NEGATIVE
Ketones, UA: NEGATIVE
Nitrite, UA: NEGATIVE
Protein, UA: POSITIVE — AB
Spec Grav, UA: 1.025 (ref 1.010–1.025)
Urobilinogen, UA: 0.2 U/dL
pH, UA: 6.5 (ref 5.0–8.0)

## 2023-07-25 MED ORDER — FLUCONAZOLE 150 MG PO TABS: ORAL_TABLET | ORAL | 0 refills | Status: DC

## 2023-07-25 NOTE — Patient Instructions (Addendum)
 Tick bite looks ok  Let us  know if the bite area dose not go away Watch for fever/rash/headache or other symptoms   Take diflucan for potential yeast infection   Urine culture sent  We will reach out with results  Keep up good fluid intake    You can try gentle heat for back pain and stretching  Update if not starting to improve in a week or if worsening

## 2023-07-25 NOTE — Assessment & Plan Note (Signed)
 Urinalysis with wbc Some left back pain but no urinary symptoms and reassuring exam  Urine culture sent Will treat if positive   Instructed to call if she develops urinary symptoms   Call back and Er precautions noted in detail today

## 2023-07-25 NOTE — Assessment & Plan Note (Signed)
 2-3 mm erythematous bite on left low back - no red flags  No tick bourne illness symptoms Was not engorged and was totally removed  Instructed  Keep clean with soap and water  Watch for enlargement/bullseye/ rash/fever/headache or other symptoms  Update if not starting to improve in a week or if worsening  Call back and Er precautions noted in detail today

## 2023-07-25 NOTE — Assessment & Plan Note (Signed)
 Suspect muscular-is positional  After heavy lifting Reassuring exam w/o neuro changes  Discussed use of heat and stretching  Walking also   Given rehab handout for back pain

## 2023-07-25 NOTE — Progress Notes (Signed)
 Subjective:    Patient ID: Erica Rollins, female    DOB: December 09, 1975, 48 y.o.   MRN: [id number]  HPI  Wt Readings from Last 3 Encounters:  07/25/23 216 lb 2 oz (98 kg)  05/15/23 214 lb (97.1 kg)  01/27/23 220 lb (99.8 kg)   38.28 kg/m  Vitals:   07/25/23 0916  BP: 126/82  Pulse: 72  Temp: 98 F (36.7 C)  SpO2: 98%    Pt presents for c/o  Sinus symptoms-overall better Tick bite-on back  Back pain - ? If possible uti    Was treated for sinusitis in late may  Augmentin  (repeat course)  Had purulent nasal drainage  Is better now  Had day of pressure and then it improved   Allegra Flonase  -continues to use    Tick bite on back  Noticed it June 8th Not engorged  Was attached / able to remove whole thing (husband)  Had been outside prior  Does not have animals  Bump is still there Not itchy now / was to begin with  No rings around it  Feels ok     Also had insect bite on right upper arm - it stung and then got better Not related   No rash  No fever   Back pain /urination  Urine has odor (this can happen after eating sugar) Some vaginal itching-used monistat and it burned   Low left back pain  Feels better to move  Hurts more to stand Has to lift 100 lb daughter   No freq/urgency today  Had in past and cleared up  No blood in urine  No history of renal stone      Urinalysis Leuk  Protein   Results for orders placed or performed in visit on 07/25/23  POCT Urinalysis Dipstick (Automated)   Collection Time: 07/25/23  9:32 AM  Result Value Ref Range   Color, UA Yellow    Clarity, UA Clear    Glucose, UA Negative Negative   Bilirubin, UA Negative    Ketones, UA Negative    Spec Grav, UA 1.025 1.010 - 1.025   Blood, UA Negative    pH, UA 6.5 5.0 - 8.0   Protein, UA Positive (A) Negative   Urobilinogen, UA 0.2 0.2 or 1.0 E.U./dL   Nitrite, UA Negative    Leukocytes, UA Large (3+) (A) Negative      Patient Active Problem List    Diagnosis Date Noted   Left low back pain 07/25/2023   Abnormal urinalysis 07/25/2023   Vaginal itching 07/25/2023   Tick bite of back 07/25/2023   Allergic rhinitis 05/15/2023   Diabetes mellitus screening 11/27/2022   Colon cancer screening 10/25/2021   Mild hyperlipidemia 10/25/2021   Visit for routine gyn exam 10/17/2020   Obesity (BMI 30-39.9) 03/13/2017   Seasonal allergies 03/21/2014   Asthma, mild 03/21/2014   Routine general medical examination at a health care facility 03/21/2014   Rosacea 03/21/2014   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/21/2014   Past Medical History:  Diagnosis Date   Allergy    Seasonal   Asthma, mild    High plasma total homocysteine    History of foot fracture    Rosacea    Past Surgical History:  Procedure Laterality Date   APPENDECTOMY     CESAREAN SECTION  2007   Social History   Tobacco Use   Smoking status: Never   Smokeless tobacco: Never  Vaping Use   Vaping status: Never Used  Substance Use Topics   Alcohol use: No    Alcohol/week: 0.0 standard drinks of alcohol   Drug use: No   Family History  Problem Relation Age of Onset   Cancer Mother    Breast cancer Mother 38 - 65   Hypertension Mother    Stroke Paternal Uncle    Arthritis Maternal Grandmother    Stroke Paternal Grandmother    Heart disease Paternal Grandfather    ADD / ADHD Daughter    Birth defects Daughter    Colon polyps Neg Hx    Colon cancer Neg Hx    Esophageal cancer Neg Hx    Rectal cancer Neg Hx    Stomach cancer Neg Hx    Allergies  Allergen Reactions   Mtx Topical Pain [Lidocaine-Menthol]     Pt has fainted with this in the past, unsure if it had epinephrine    Peanut (Diagnostic) Rash    Peanuts (food)    Current Outpatient Medications on File Prior to Visit  Medication Sig Dispense Refill   albuterol  (PROVENTIL  HFA;VENTOLIN  HFA) 108 (90 Base) MCG/ACT inhaler Inhale 2 puffs into the lungs every 6 (six) hours as needed for wheezing or shortness  of breath. 1 Inhaler 5   Calcium Carbonate-Vitamin D (CALCIUM + D PO) Take 1 capsule by mouth daily.     fexofenadine (ALLEGRA) 180 MG tablet Take 180 mg by mouth daily.     FIBER PO Take 4 g by mouth daily.     fluticasone  (FLONASE ) 50 MCG/ACT nasal spray Place 2 sprays into both nostrils daily. 16 g 6   FOLIC ACID PO Take 1 tablet by mouth daily.     ibuprofen (ADVIL) 200 MG tablet Take 400 mg by mouth in the morning.     levonorgestrel -ethinyl estradiol (ALTAVERA) 0.15-30 MG-MCG tablet TAKE 1 TABLET BY MOUTH DAILY. TAKE CONTINUOUSLY 84 tablet 3   METRONIDAZOLE , TOPICAL, 0.75 % LOTN Apply to affected areas of face once daily at bedtime 59 mL 3   Prenatal Vit-Fe Fumarate-FA (MULTIVITAMIN-PRENATAL) 27-0.8 MG TABS tablet Take 1 tablet by mouth daily at 12 noon.     Probiotic Product (PROBIOTIC PO) Take 1 capsule by mouth daily.     No current facility-administered medications on file prior to visit.    Review of Systems  Constitutional:  Negative for activity change, appetite change, fatigue, fever and unexpected weight change.  HENT:  Negative for congestion, ear pain, rhinorrhea, sinus pressure and sore throat.   Eyes:  Negative for pain, redness and visual disturbance.  Respiratory:  Negative for cough, shortness of breath and wheezing.   Cardiovascular:  Negative for chest pain and palpitations.  Gastrointestinal:  Negative for abdominal pain, blood in stool, constipation and diarrhea.  Endocrine: Negative for polydipsia and polyuria.  Genitourinary:  Negative for dysuria, frequency and urgency.  Musculoskeletal:  Positive for back pain. Negative for arthralgias and myalgias.  Skin:  Negative for pallor and rash.       Insect bite   Allergic/Immunologic: Negative for environmental allergies.  Neurological:  Negative for dizziness, syncope and headaches.  Hematological:  Negative for adenopathy. Does not bruise/bleed easily.  Psychiatric/Behavioral:  Negative for decreased  concentration and dysphoric mood. The patient is not nervous/anxious.        Objective:   Physical Exam Constitutional:      General: She is not in acute distress.    Appearance: Normal appearance. She is well-developed. She is obese. She is not ill-appearing or diaphoretic.  HENT:  Head: Normocephalic and atraumatic.     Comments: Mild maxillary sinus tenderness/ improved  No swelling     Nose: No congestion.     Mouth/Throat:     Mouth: Mucous membranes are moist.     Pharynx: Oropharynx is clear.   Eyes:     General:        Right eye: No discharge.        Left eye: No discharge.     Conjunctiva/sclera: Conjunctivae normal.     Pupils: Pupils are equal, round, and reactive to light.   Neck:     Thyroid: No thyromegaly.     Vascular: No carotid bruit or JVD.   Cardiovascular:     Rate and Rhythm: Normal rate and regular rhythm.     Heart sounds: Normal heart sounds.     No gallop.  Pulmonary:     Effort: Pulmonary effort is normal. No respiratory distress.     Breath sounds: Normal breath sounds. No stridor. No wheezing, rhonchi or rales.  Abdominal:     General: There is no distension or abdominal bruit.     Palpations: Abdomen is soft.     Tenderness: There is no abdominal tenderness. There is no right CVA tenderness or left CVA tenderness.     Comments: No suprapubic tenderness or fullness     Musculoskeletal:     Cervical back: Normal range of motion and neck supple.     Right lower leg: No edema.     Left lower leg: No edema.     Comments: Mild tenderness of left sided lumbar musculature No bony tenderness  No cva tenderness  Flex- pain with 90 deg Ext- no pain  Lat bend normal   Bent knee raise causes back but not leg pain    Lymphadenopathy:     Cervical: No cervical adenopathy.   Skin:    General: Skin is warm and dry.     Coloration: Skin is not pale.     Findings: No rash.     Comments: 2-3 mm pink papule with scab on left side of low  back/flank  No rings or central clearing No rash  No tenderness   2 small whelps right upper/inferior arm that appear to be fading     Neurological:     Mental Status: She is alert.     Motor: No weakness.     Coordination: Coordination normal.     Gait: Gait normal.     Deep Tendon Reflexes: Reflexes are normal and symmetric. Reflexes normal.   Psychiatric:        Mood and Affect: Mood normal.           Assessment & Plan:   Problem List Items Addressed This Visit       Respiratory   RESOLVED: Acute non-recurrent maxillary sinusitis   Relevant Medications   fluconazole (DIFLUCAN) 150 MG tablet     Musculoskeletal and Integument   Tick bite of back   2-3 mm erythematous bite on left low back - no red flags  No tick bourne illness symptoms Was not engorged and was totally removed  Instructed  Keep clean with soap and water  Watch for enlargement/bullseye/ rash/fever/headache or other symptoms  Update if not starting to improve in a week or if worsening  Call back and Er precautions noted in detail today          Genitourinary   Vaginal itching   After several rounds of antibiotic  for sinusitis Recent use of topical monistat-did not tolerate   Treatment with diflucan 150 mg times 2 Update if not starting to improve in a week or if worsening  Call back and Er precautions noted in detail today          Other   Left low back pain - Primary   Suspect muscular-is positional  After heavy lifting Reassuring exam w/o neuro changes  Discussed use of heat and stretching  Walking also   Given rehab handout for back pain       Relevant Orders   Urine Culture   Abnormal urinalysis   Urinalysis with wbc Some left back pain but no urinary symptoms and reassuring exam  Urine culture sent Will treat if positive   Instructed to call if she develops urinary symptoms   Call back and Er precautions noted in detail today        Relevant Orders   Urine  Culture   Other Visit Diagnoses       Low back pain, unspecified back pain laterality, unspecified chronicity, unspecified whether sciatica present       Relevant Orders   POCT Urinalysis Dipstick (Automated) (Completed)

## 2023-07-25 NOTE — Assessment & Plan Note (Signed)
 After several rounds of antibiotic for sinusitis Recent use of topical monistat-did not tolerate   Treatment with diflucan 150 mg times 2 Update if not starting to improve in a week or if worsening  Call back and Er precautions noted in detail today

## 2023-07-26 LAB — URINE CULTURE
MICRO NUMBER:: 16606265
Result:: NO GROWTH
SPECIMEN QUALITY:: ADEQUATE

## 2023-07-27 ENCOUNTER — Ambulatory Visit: Payer: Self-pay | Admitting: Family Medicine

## 2023-10-08 ENCOUNTER — Other Ambulatory Visit: Payer: Self-pay | Admitting: Family Medicine

## 2023-10-08 DIAGNOSIS — Z1231 Encounter for screening mammogram for malignant neoplasm of breast: Secondary | ICD-10-CM

## 2023-10-17 ENCOUNTER — Other Ambulatory Visit: Payer: Self-pay | Admitting: Family Medicine

## 2023-11-05 ENCOUNTER — Ambulatory Visit
Admission: RE | Admit: 2023-11-05 | Discharge: 2023-11-05 | Disposition: A | Source: Ambulatory Visit | Attending: Family Medicine | Admitting: Family Medicine

## 2023-11-05 ENCOUNTER — Telehealth: Payer: Self-pay

## 2023-11-05 DIAGNOSIS — Z1231 Encounter for screening mammogram for malignant neoplasm of breast: Secondary | ICD-10-CM

## 2023-11-05 NOTE — Telephone Encounter (Signed)
 Please schedule fasting labs prior to CPE, PCP places orders closer to appt date

## 2023-11-05 NOTE — Telephone Encounter (Signed)
 Copied from CRM #[id number]. Topic: Clinical - Request for Lab/Test Order >> Nov 05, 2023 10:37 AM Donna BRAVO wrote: Reason for CRM: patient would like to have labs done 1 week before appt on 12/10/23 please let patient know when labs have been ordered.

## 2023-11-05 NOTE — Telephone Encounter (Signed)
 I called and left a voicemail for patient to be scheduled.

## 2023-11-08 ENCOUNTER — Ambulatory Visit: Payer: Self-pay | Admitting: Family Medicine

## 2023-11-28 ENCOUNTER — Encounter: Admitting: Family Medicine

## 2023-12-01 ENCOUNTER — Telehealth: Payer: Self-pay | Admitting: Family Medicine

## 2023-12-01 DIAGNOSIS — E785 Hyperlipidemia, unspecified: Secondary | ICD-10-CM

## 2023-12-01 DIAGNOSIS — Z0000 Encounter for general adult medical examination without abnormal findings: Secondary | ICD-10-CM

## 2023-12-01 DIAGNOSIS — Z131 Encounter for screening for diabetes mellitus: Secondary | ICD-10-CM

## 2023-12-01 NOTE — Telephone Encounter (Signed)
-----   Message from Harlene Du sent at 11/25/2023  2:30 PM EDT ----- Regarding: Lab Wed 12/03/23 Hello,  Patient is coming in for CPE labs on Wednesday 12/03/23. Can we get orders please.   Thanks

## 2023-12-03 ENCOUNTER — Other Ambulatory Visit

## 2023-12-03 ENCOUNTER — Ambulatory Visit: Payer: Self-pay | Admitting: Family Medicine

## 2023-12-03 DIAGNOSIS — E785 Hyperlipidemia, unspecified: Secondary | ICD-10-CM | POA: Diagnosis not present

## 2023-12-03 DIAGNOSIS — Z131 Encounter for screening for diabetes mellitus: Secondary | ICD-10-CM | POA: Diagnosis not present

## 2023-12-03 DIAGNOSIS — Z0000 Encounter for general adult medical examination without abnormal findings: Secondary | ICD-10-CM | POA: Diagnosis not present

## 2023-12-03 LAB — CBC WITH DIFFERENTIAL/PLATELET
Basophils Absolute: 0.1 K/uL (ref 0.0–0.1)
Basophils Relative: 0.7 % (ref 0.0–3.0)
Eosinophils Absolute: 0.3 K/uL (ref 0.0–0.7)
Eosinophils Relative: 3.2 % (ref 0.0–5.0)
HCT: 41.6 % (ref 36.0–46.0)
Hemoglobin: 14 g/dL (ref 12.0–15.0)
Lymphocytes Relative: 25.7 % (ref 12.0–46.0)
Lymphs Abs: 2.1 K/uL (ref 0.7–4.0)
MCHC: 33.7 g/dL (ref 30.0–36.0)
MCV: 88.8 fl (ref 78.0–100.0)
Monocytes Absolute: 0.4 K/uL (ref 0.1–1.0)
Monocytes Relative: 4.8 % (ref 3.0–12.0)
Neutro Abs: 5.3 K/uL (ref 1.4–7.7)
Neutrophils Relative %: 65.6 % (ref 43.0–77.0)
Platelets: 265 K/uL (ref 150.0–400.0)
RBC: 4.69 Mil/uL (ref 3.87–5.11)
RDW: 13.8 % (ref 11.5–15.5)
WBC: 8 K/uL (ref 4.0–10.5)

## 2023-12-03 LAB — COMPREHENSIVE METABOLIC PANEL WITH GFR
ALT: 13 U/L (ref 0–35)
AST: 15 U/L (ref 0–37)
Albumin: 4 g/dL (ref 3.5–5.2)
Alkaline Phosphatase: 52 U/L (ref 39–117)
BUN: 14 mg/dL (ref 6–23)
CO2: 26 meq/L (ref 19–32)
Calcium: 8.8 mg/dL (ref 8.4–10.5)
Chloride: 105 meq/L (ref 96–112)
Creatinine, Ser: 0.76 mg/dL (ref 0.40–1.20)
GFR: 93.46 mL/min (ref >60.00)
Glucose, Bld: 86 mg/dL (ref 70–99)
Potassium: 4.2 meq/L (ref 3.5–5.1)
Sodium: 137 meq/L (ref 135–145)
Total Bilirubin: 0.5 mg/dL (ref 0.2–1.2)
Total Protein: 6.6 g/dL (ref 6.0–8.3)

## 2023-12-03 LAB — LIPID PANEL
Cholesterol: 220 mg/dL — ABNORMAL HIGH (ref 0–200)
HDL: 47.5 mg/dL (ref >39.00)
LDL Cholesterol: 151 mg/dL — ABNORMAL HIGH (ref 0–99)
NonHDL: 172.03
Total CHOL/HDL Ratio: 5
Triglycerides: 104 mg/dL (ref 0.0–149.0)
VLDL: 20.8 mg/dL (ref 0.0–40.0)

## 2023-12-03 LAB — HEMOGLOBIN A1C: Hgb A1c MFr Bld: 5.3 % (ref 4.6–6.5)

## 2023-12-03 LAB — TSH: TSH: 1.32 u[IU]/mL (ref 0.35–5.50)

## 2023-12-10 ENCOUNTER — Encounter: Payer: Self-pay | Admitting: Family Medicine

## 2023-12-10 ENCOUNTER — Ambulatory Visit (INDEPENDENT_AMBULATORY_CARE_PROVIDER_SITE_OTHER): Admitting: Family Medicine

## 2023-12-10 VITALS — BP 118/78 | HR 74 | Temp 98.3°F | Ht 62.5 in | Wt 223.5 lb

## 2023-12-10 DIAGNOSIS — Z0000 Encounter for general adult medical examination without abnormal findings: Secondary | ICD-10-CM | POA: Diagnosis not present

## 2023-12-10 DIAGNOSIS — Z131 Encounter for screening for diabetes mellitus: Secondary | ICD-10-CM | POA: Diagnosis not present

## 2023-12-10 DIAGNOSIS — E785 Hyperlipidemia, unspecified: Secondary | ICD-10-CM | POA: Diagnosis not present

## 2023-12-10 DIAGNOSIS — Z1211 Encounter for screening for malignant neoplasm of colon: Secondary | ICD-10-CM

## 2023-12-10 MED ORDER — LEVONORGESTREL-ETHINYL ESTRAD 0.15-30 MG-MCG PO TABS: ORAL_TABLET | ORAL | 3 refills | Status: AC

## 2023-12-10 NOTE — Assessment & Plan Note (Signed)
 Disc goals for lipids and reasons to control them Rev last labs with pt Rev low sat fat diet in detail LDL 151 Fairly stable May try omega 3 for HDL  ASCVD risk score 1.2%   Will work on lifestyle change

## 2023-12-10 NOTE — Assessment & Plan Note (Signed)
 Colonoscopy 01/2023 with 5 y recall

## 2023-12-10 NOTE — Assessment & Plan Note (Signed)
 Reviewed health habits including diet and exercise and skin cancer prevention Reviewed appropriate screening tests for age  Also reviewed health mt list, fam hx and immunization status , as well as social and family history   See HPI Labs reviewed and ordered Health Maintenance  Topic Date Due   Hepatitis C Screening  Never done   Hepatitis B Vaccine (1 of 3 - 19+ 3-dose series) Never done   Pneumococcal Vaccine (2 of 2 - PCV) 03/12/2018   HIV Screening  03/14/2027*   DTaP/Tdap/Td vaccine (2 - Td or Tdap) 03/21/2024   Pap with HPV screening  10/17/2025   Breast Cancer Screening  11/04/2025   Colon Cancer Screening  01/27/2028   Flu Shot  Completed   COVID-19 Vaccine  Completed   HPV Vaccine  Aged Out   Meningitis B Vaccine  Aged Out  *Topic was postponed. The date shown is not the original due date.    Discussed fall prevention, supplements and exercise for bone density  Colonoscopy due 2029  PHQ 4 in setting of stressors (pt thinks she is doing well)

## 2023-12-10 NOTE — Progress Notes (Signed)
 Subjective:    Patient ID: Erica Rollins, female    DOB: 04/10/1975, 48 y.o.   MRN: [id number]  HPI  Here for health maintenance exam and to review chronic medical problems   Wt Readings from Last 3 Encounters:  12/10/23 223 lb 8 oz (101.4 kg)  07/25/23 216 lb 2 oz (98 kg)  05/15/23 214 lb (97.1 kg)   40.23 kg/m  Vitals:   12/10/23 1520  BP: 118/78  Pulse: 74  Temp: 98.3 F (36.8 C)  SpO2: 98%    Immunization History  Administered Date(s) Administered   Influenza, Mdck, Trivalent,PF 6+ MOS(egg free) 10/05/2022, 10/24/2023   Influenza,inj,Quad PF,6+ Mos 09/30/2018, 10/17/2020, 10/25/2021   Influenza-Unspecified 10/19/2013, 11/04/2015, 11/03/2016   PFIZER Comirnaty(Gray Top)Covid-19 Tri-Sucrose Vaccine 10/29/2021   PFIZER(Purple Top)SARS-COV-2 Vaccination 03/19/2019, 04/13/2019   Pfizer Covid-19 Vaccine Bivalent Booster 33yrs & up 11/07/2020   Pfizer(Comirnaty)Fall Seasonal Vaccine 12 years and older 10/24/2023   Pneumococcal Polysaccharide-23 03/12/2017   Tdap 03/21/2014    Health Maintenance Due  Topic Date Due   Hepatitis C Screening  Never done   Hepatitis B Vaccines 19-59 Average Risk (1 of 3 - 19+ 3-dose series) Never done   Pneumococcal Vaccine (2 of 2 - PCV) 03/12/2018   Feeling good despite stress Trying to take care of herself     Mammogram 11/2023  Self breast exam- no lumps if she checks   Gyn health Pap 10/2020  Oral contraceptive 0.15-30 levonorgestrel  ee  Continuous usually - occational has a withdrawal week   Has appointment with Dr Mat in December to discuss perimenopause   Colon cancer screening  Colonoscopy 01/2023  5 y recall   Bone health   Falls- none  Fractures-none  Supplements ca and D    Exercise  In PT for low back / [facility]  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  Pool classes once per week  Wants to move more   Has kettle bells  Does liver 90 lb child   Also meeting with [facility]etic[facility]  Excited to  get back into there routine      Mood    12/10/2023    3:24 PM 07/25/2023    9:27 AM 11/27/2022    9:52 AM 10/25/2021   10:40 AM 10/17/2020   10:06 AM  Depression screen PHQ 2/9  Decreased Interest 1 1 1  0 0  Down, Depressed, Hopeless 0 0 1 0 0  PHQ - 2 Score 1 1 2  0 0  Altered sleeping 1 1 1 1    Tired, decreased energy 0 1 1 1    Change in appetite 1 1 1 1    Feeling bad or failure about yourself  0 0 1 0   Trouble concentrating 1 1 1  0   Moving slowly or fidgety/restless 0 0 1 0   Suicidal thoughts 0 0 0 0   PHQ-9 Score 4 5 8 3    Difficult doing work/chores Not difficult at all Not difficult at all Somewhat difficult     Constant stress  Caring for chronically sick child trying to cope with college  Dealing well with it  Used to it   Does see a mental health therapist for stress -is helpful     Diabetes screen Lab Results  Component Value Date   HGBA1C 5.3 12/03/2023   HGBA1C 5.3 11/27/2022   Trying to eat more clean   Hyperlipidemia Lab Results  Component Value Date   CHOL 220 (H) 12/03/2023  CHOL 217 (H) 11/27/2022   CHOL 183 10/18/2021   Lab Results  Component Value Date   HDL 47.50 12/03/2023   HDL 45.30 11/27/2022   HDL 49.20 10/18/2021   Lab Results  Component Value Date   LDLCALC 151 (H) 12/03/2023   LDLCALC 148 (H) 11/27/2022   LDLCALC 117 (H) 10/18/2021   Lab Results  Component Value Date   TRIG 104.0 12/03/2023   TRIG 117.0 11/27/2022   TRIG 87.0 10/18/2021   Lab Results  Component Value Date   CHOLHDL 5 12/03/2023   CHOLHDL 5 11/27/2022   CHOLHDL 4 10/18/2021   No results found for: LDLDIRECT  The 10-year ASCVD risk score (Arnett DK, et al., 2019) is: 1.2%   Values used to calculate the score:     Age: 48 years     Clincally relevant sex: Female     Is Non-Hispanic African American: No     Diabetic: No     Tobacco smoker: No     Systolic Blood Pressure: 118 mmHg     Is BP treated: No     HDL Cholesterol: 47.5 mg/dL      Total Cholesterol: 220 mg/dL  Watching diet  Could decrease her beef     Lab Results  Component Value Date   ALT 13 12/03/2023   AST 15 12/03/2023   ALKPHOS 52 12/03/2023   BILITOT 0.5 12/03/2023    Lab Results  Component Value Date   NA 137 12/03/2023   K 4.2 12/03/2023   CO2 26 12/03/2023   GLUCOSE 86 12/03/2023   BUN 14 12/03/2023   CREATININE 0.76 12/03/2023   CALCIUM 8.8 12/03/2023   GFR 93.46 12/03/2023   Lab Results  Component Value Date   WBC 8.0 12/03/2023   HGB 14.0 12/03/2023   HCT 41.6 12/03/2023   MCV 88.8 12/03/2023   PLT 265.0 12/03/2023   Lab Results  Component Value Date   TSH 1.32 12/03/2023      Patient Active Problem List   Diagnosis Date Noted   Left low back pain 07/25/2023   Allergic rhinitis 05/15/2023   Diabetes mellitus screening 11/27/2022   Colon cancer screening 10/25/2021   Mild hyperlipidemia 10/25/2021   Visit for routine gyn exam 10/17/2020   Morbid obesity (HCC) 03/13/2017   Seasonal allergies 03/21/2014   Asthma, mild 03/21/2014   Routine general medical examination at a health care facility 03/21/2014   Rosacea 03/21/2014   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/2014   Past Medical History:  Diagnosis Date   Allergy    Seasonal   Asthma, mild    High plasma total homocysteine    History of foot fracture    Rosacea    Past Surgical History:  Procedure Laterality Date   APPENDECTOMY     CESAREAN SECTION  2007   Social History   Tobacco Use   Smoking status: Never   Smokeless tobacco: Never  Vaping Use   Vaping status: Never Used  Substance Use Topics   Alcohol use: No    Alcohol/week: 0.0 standard drinks of alcohol   Drug use: No   Family History  Problem Relation Age of Onset   Cancer Mother    Breast cancer Mother 11 - 58   Hypertension Mother    Stroke Paternal Uncle    Arthritis Maternal Grandmother    Stroke Paternal Grandmother    Heart disease Paternal Grandfather    ADD / ADHD Daughter    Birth  defects Daughter  Colon polyps Neg Hx    Colon cancer Neg Hx    Esophageal cancer Neg Hx    Rectal cancer Neg Hx    Stomach cancer Neg Hx    Allergies  Allergen Reactions   Mtx Topical Pain [Lidocaine-Menthol]     Pt has fainted with this in the past, unsure if it had epinephrine    Peanut (Diagnostic) Rash    Peanuts (food)    Current Outpatient Medications on File Prior to Visit  Medication Sig Dispense Refill   albuterol  (PROVENTIL  HFA;VENTOLIN  HFA) 108 (90 Base) MCG/ACT inhaler Inhale 2 puffs into the lungs every 6 (six) hours as needed for wheezing or shortness of breath. 1 Inhaler 5   Calcium Carbonate-Vitamin D (CALCIUM + D PO) Take 1 capsule by mouth daily.     fexofenadine (ALLEGRA) 180 MG tablet Take 180 mg by mouth daily.     FIBER PO Take 4 g by mouth daily.     fluticasone  (FLONASE ) 50 MCG/ACT nasal spray Place 2 sprays into both nostrils daily. 16 g 6   FOLIC ACID PO Take 1 tablet by mouth daily.     ibuprofen (ADVIL) 200 MG tablet Take 400 mg by mouth in the morning.     METRONIDAZOLE , TOPICAL, 0.75 % LOTN Apply to affected areas of face once daily at bedtime 59 mL 3   Prenatal Vit-Fe Fumarate-FA (MULTIVITAMIN-PRENATAL) 27-0.8 MG TABS tablet Take 1 tablet by mouth daily at 12 noon.     Probiotic Product (PROBIOTIC PO) Take 1 capsule by mouth daily.     No current facility-administered medications on file prior to visit.    Review of Systems  Constitutional:  Positive for fatigue. Negative for activity change, appetite change, fever and unexpected weight change.  HENT:  Negative for congestion, ear pain, rhinorrhea, sinus pressure and sore throat.   Eyes:  Negative for pain, redness and visual disturbance.  Respiratory:  Negative for cough, shortness of breath and wheezing.   Cardiovascular:  Negative for chest pain and palpitations.  Gastrointestinal:  Negative for abdominal pain, blood in stool, constipation and diarrhea.  Endocrine: Negative for polydipsia and  polyuria.  Genitourinary:  Negative for dysuria, frequency and urgency.  Musculoskeletal:  Negative for arthralgias, back pain and myalgias.  Skin:  Negative for pallor and rash.  Allergic/Immunologic: Negative for environmental allergies.  Neurological:  Negative for dizziness, syncope and headaches.  Hematological:  Negative for adenopathy. Does not bruise/bleed easily.  Psychiatric/Behavioral:  Negative for decreased concentration and dysphoric mood. The patient is not nervous/anxious.        Very high stress Anxious at times        Objective:   Physical Exam Constitutional:      General: She is not in acute distress.    Appearance: Normal appearance. She is well-developed. She is obese. She is not ill-appearing or diaphoretic.  HENT:     Head: Normocephalic and atraumatic.     Right Ear: Tympanic membrane, ear canal and external ear normal.     Left Ear: Tympanic membrane, ear canal and external ear normal.     Nose: Nose normal. No congestion.     Mouth/Throat:     Mouth: Mucous membranes are moist.     Pharynx: Oropharynx is clear. No posterior oropharyngeal erythema.  Eyes:     General: No scleral icterus.    Extraocular Movements: Extraocular movements intact.     Conjunctiva/sclera: Conjunctivae normal.     Pupils: Pupils are equal, round, and  reactive to light.  Neck:     Thyroid: No thyromegaly.     Vascular: No carotid bruit or JVD.  Cardiovascular:     Rate and Rhythm: Normal rate and regular rhythm.     Pulses: Normal pulses.     Heart sounds: Normal heart sounds.     No gallop.  Pulmonary:     Effort: Pulmonary effort is normal. No respiratory distress.     Breath sounds: Normal breath sounds. No wheezing.     Comments: Good air exch Chest:     Chest wall: No tenderness.  Abdominal:     General: Bowel sounds are normal. There is no distension or abdominal bruit.     Palpations: Abdomen is soft. There is no mass.     Tenderness: There is no abdominal  tenderness.     Hernia: No hernia is present.  Genitourinary:    Comments: Breast exam: No mass, nodules, thickening, tenderness, bulging, retraction, inflamation, nipple discharge or skin changes noted.  No axillary or clavicular LA.     Musculoskeletal:        General: No tenderness. Normal range of motion.     Cervical back: Normal range of motion and neck supple. No rigidity. No muscular tenderness.     Right lower leg: No edema.     Left lower leg: No edema.     Comments: No kyphosis   Lymphadenopathy:     Cervical: No cervical adenopathy.  Skin:    General: Skin is warm and dry.     Coloration: Skin is not pale.     Findings: No erythema or rash.     Comments: Solar lentigines diffusely   Neurological:     Mental Status: She is alert. Mental status is at baseline.     Cranial Nerves: No cranial nerve deficit.     Motor: No abnormal muscle tone.     Coordination: Coordination normal.     Gait: Gait normal.     Deep Tendon Reflexes: Reflexes are normal and symmetric. Reflexes normal.  Psychiatric:        Mood and Affect: Mood normal.        Cognition and Memory: Cognition and memory normal.           Assessment & Plan:   Problem List Items Addressed This Visit       Other   Routine general medical examination at a health care facility - Primary   Reviewed health habits including diet and exercise and skin cancer prevention Reviewed appropriate screening tests for age  Also reviewed health mt list, fam hx and immunization status , as well as social and family history   See HPI Labs reviewed and ordered Health Maintenance  Topic Date Due   Hepatitis C Screening  Never done   Hepatitis B Vaccine (1 of 3 - 19+ 3-dose series) Never done   Pneumococcal Vaccine (2 of 2 - PCV) 03/12/2018   HIV Screening  03/14/2027*   DTaP/Tdap/Td vaccine (2 - Td or Tdap) 03/21/2024   Pap with HPV screening  10/17/2025   Breast Cancer Screening  11/04/2025   Colon Cancer Screening   01/27/2028   Flu Shot  Completed   COVID-19 Vaccine  Completed   HPV Vaccine  Aged Out   Meningitis B Vaccine  Aged Out  *Topic was postponed. The date shown is not the original due date.    Discussed fall prevention, supplements and exercise for bone density  Colonoscopy due 2029  PHQ 4 in setting of stressors (pt thinks she is doing well)      Morbid obesity (HCC)   Discussed how this problem influences overall health and the risks it imposes  Reviewed plan for weight loss with lower calorie diet (via better food choices (lower glycemic and portion control) along with exercise building up to or more than 30 minutes 5 days per week including some aerobic activity and strength training   Pt has little time for self care Plans to go back and work with nutritionist through [facility]       Mild hyperlipidemia   Disc goals for lipids and reasons to control them Rev last labs with pt Rev low sat fat diet in detail LDL 151 Fairly stable May try omega 3 for HDL  ASCVD risk score 1.2%   Will work on lifestyle change       Diabetes mellitus screening   Lab Results  Component Value Date   HGBA1C 5.3 12/03/2023   HGBA1C 5.3 11/27/2022   disc imp of low glycemic diet and wt loss to prevent DM2       Colon cancer screening   Colonoscopy 01/2023 with 5 y recall

## 2023-12-10 NOTE — Assessment & Plan Note (Signed)
 Discussed how this problem influences overall health and the risks it imposes  Reviewed plan for weight loss with lower calorie diet (via better food choices (lower glycemic and portion control) along with exercise building up to or more than 30 minutes 5 days per week including some aerobic activity and strength training   Pt has little time for self care Plans to go back and work with nutritionist through [employer]

## 2023-12-10 NOTE — Patient Instructions (Addendum)
 Stay active When you can -add strength training  Add some strength training to your routine, this is important for bone and brain health and can reduce your risk of falls and help your body use insulin properly and regulate weight  Light weights, exercise bands , and internet videos are a good way to start  Yoga (chair or regular), machines , floor exercises or a gym with machines are also good options    For cholesterol Avoid red meat/ fried foods/ egg yolks/ fatty breakfast meats/ butter, cheese and high fat dairy/ and shellfish

## 2023-12-10 NOTE — Assessment & Plan Note (Signed)
 Lab Results  Component Value Date   HGBA1C 5.3 12/03/2023   HGBA1C 5.3 11/27/2022   disc imp of low glycemic diet and wt loss to prevent DM2
